# Patient Record
Sex: Male | Born: 1950 | Race: White | Hispanic: No | Marital: Married | Smoking: Former smoker
Health system: Southern US, Community
[De-identification: ages and names within clinical notes are randomized; demographics above are authoritative.]

## PROBLEM LIST (undated history)

## (undated) DIAGNOSIS — R0600 Dyspnea, unspecified: Secondary | ICD-10-CM

## (undated) DIAGNOSIS — M109 Gout, unspecified: Secondary | ICD-10-CM

## (undated) DIAGNOSIS — W3400XA Accidental discharge from unspecified firearms or gun, initial encounter: Secondary | ICD-10-CM

## (undated) DIAGNOSIS — Z8719 Personal history of other diseases of the digestive system: Secondary | ICD-10-CM

## (undated) DIAGNOSIS — E119 Type 2 diabetes mellitus without complications: Secondary | ICD-10-CM

## (undated) DIAGNOSIS — K219 Gastro-esophageal reflux disease without esophagitis: Secondary | ICD-10-CM

## (undated) DIAGNOSIS — H9190 Unspecified hearing loss, unspecified ear: Secondary | ICD-10-CM

## (undated) DIAGNOSIS — R490 Dysphonia: Secondary | ICD-10-CM

## (undated) DIAGNOSIS — E7800 Pure hypercholesterolemia, unspecified: Secondary | ICD-10-CM

## (undated) DIAGNOSIS — R51 Headache: Secondary | ICD-10-CM

## (undated) DIAGNOSIS — I2510 Atherosclerotic heart disease of native coronary artery without angina pectoris: Secondary | ICD-10-CM

## (undated) DIAGNOSIS — T4145XA Adverse effect of unspecified anesthetic, initial encounter: Secondary | ICD-10-CM

## (undated) DIAGNOSIS — I350 Nonrheumatic aortic (valve) stenosis: Secondary | ICD-10-CM

## (undated) DIAGNOSIS — I10 Essential (primary) hypertension: Secondary | ICD-10-CM

## (undated) DIAGNOSIS — T8859XA Other complications of anesthesia, initial encounter: Secondary | ICD-10-CM

## (undated) DIAGNOSIS — M1990 Unspecified osteoarthritis, unspecified site: Secondary | ICD-10-CM

## (undated) DIAGNOSIS — I6522 Occlusion and stenosis of left carotid artery: Secondary | ICD-10-CM

## (undated) DIAGNOSIS — R7303 Prediabetes: Secondary | ICD-10-CM

## (undated) HISTORY — PX: EYE SURGERY: SHX253

## (undated) HISTORY — PX: CARDIAC CATHETERIZATION: SHX172

## (undated) HISTORY — PX: CORONARY ARTERY BYPASS GRAFT: SHX141

## (undated) HISTORY — PX: COLONOSCOPY W/ BIOPSIES AND POLYPECTOMY: SHX1376

---
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
## 1971-09-14 HISTORY — PX: KNEE SURGERY: SHX244

## 2011-05-31 DIAGNOSIS — I2510 Atherosclerotic heart disease of native coronary artery without angina pectoris: Secondary | ICD-10-CM

## 2011-06-04 DIAGNOSIS — I2510 Atherosclerotic heart disease of native coronary artery without angina pectoris: Secondary | ICD-10-CM

## 2011-07-07 ENCOUNTER — Encounter: Payer: Self-pay | Admitting: Thoracic Surgery (Cardiothoracic Vascular Surgery)

## 2011-07-07 ENCOUNTER — Ambulatory Visit (INDEPENDENT_AMBULATORY_CARE_PROVIDER_SITE_OTHER): Payer: BC Managed Care – PPO | Admitting: Thoracic Surgery (Cardiothoracic Vascular Surgery)

## 2011-07-07 DIAGNOSIS — I2510 Atherosclerotic heart disease of native coronary artery without angina pectoris: Secondary | ICD-10-CM

## 2011-07-07 DIAGNOSIS — Z951 Presence of aortocoronary bypass graft: Secondary | ICD-10-CM

## 2011-07-07 NOTE — Progress Notes (Signed)
The patient is doing very well. He has had no chest pain. Sternum stable, wounds well healed. He is walking 3 miles a day. No smoking. He has seen his cardiologist. 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 Sternal precautions were reviewed. RTC prn.

## 2013-02-11 HISTORY — PX: CORONARY ARTERY BYPASS GRAFT: SHX141

## 2015-04-14 DEATH — deceased

## 2016-10-14 HISTORY — PX: KNEE CARTILAGE SURGERY: SHX688

## 2017-10-20 ENCOUNTER — Other Ambulatory Visit: Payer: Self-pay | Admitting: Orthopedic Surgery

## 2017-10-26 ENCOUNTER — Encounter (HOSPITAL_COMMUNITY): Payer: Self-pay

## 2017-10-26 ENCOUNTER — Ambulatory Visit (HOSPITAL_COMMUNITY)
Admission: RE | Admit: 2017-10-26 | Discharge: 2017-10-26 | Disposition: A | Discharge status: Home / Self Care | Payer: Medicare Other | Source: Ambulatory Visit | Attending: Orthopedic Surgery | Admitting: Orthopedic Surgery

## 2017-10-26 ENCOUNTER — Other Ambulatory Visit: Payer: Self-pay

## 2017-10-26 ENCOUNTER — Encounter (HOSPITAL_COMMUNITY)
Admission: RE | Admit: 2017-10-26 | Discharge: 2017-10-26 | Disposition: A | Discharge status: Home / Self Care | Payer: Medicare Other | Source: Ambulatory Visit | Attending: Orthopedic Surgery | Admitting: Orthopedic Surgery

## 2017-10-26 DIAGNOSIS — Z01810 Encounter for preprocedural cardiovascular examination: Secondary | ICD-10-CM | POA: Diagnosis present

## 2017-10-26 DIAGNOSIS — M1711 Unilateral primary osteoarthritis, right knee: Secondary | ICD-10-CM | POA: Insufficient documentation

## 2017-10-26 DIAGNOSIS — Z01818 Encounter for other preprocedural examination: Secondary | ICD-10-CM

## 2017-10-26 DIAGNOSIS — Z01812 Encounter for preprocedural laboratory examination: Secondary | ICD-10-CM | POA: Diagnosis present

## 2017-10-26 HISTORY — DX: Unspecified osteoarthritis, unspecified site: M19.90

## 2017-10-26 HISTORY — DX: Gout, unspecified: M10.9

## 2017-10-26 HISTORY — DX: Headache: R51

## 2017-10-26 HISTORY — DX: Gastro-esophageal reflux disease without esophagitis: K21.9

## 2017-10-26 HISTORY — DX: Essential (primary) hypertension: I10

## 2017-10-26 HISTORY — DX: Accidental discharge from unspecified firearms or gun, initial encounter: W34.00XA

## 2017-10-26 HISTORY — DX: Pure hypercholesterolemia, unspecified: E78.00

## 2017-10-26 HISTORY — DX: Nonrheumatic aortic (valve) stenosis: I35.0

## 2017-10-26 HISTORY — DX: Personal history of other diseases of the digestive system: Z87.19

## 2017-10-26 HISTORY — DX: Atherosclerotic heart disease of native coronary artery without angina pectoris: I25.10

## 2017-10-26 HISTORY — DX: Unspecified hearing loss, unspecified ear: H91.90

## 2017-10-26 LAB — URINALYSIS, ROUTINE W REFLEX MICROSCOPIC
Bilirubin Urine: NEGATIVE
Glucose, UA: NEGATIVE mg/dL
Hgb urine dipstick: NEGATIVE
Ketones, ur: NEGATIVE mg/dL
LEUKOCYTES UA: NEGATIVE
Nitrite: NEGATIVE
PROTEIN: NEGATIVE mg/dL
Specific Gravity, Urine: 1.013 (ref 1.005–1.030)
pH: 7 (ref 5.0–8.0)

## 2017-10-26 LAB — CBC WITH DIFFERENTIAL/PLATELET
BASOS PCT: 0 %
Basophils Absolute: 0 10*3/uL (ref 0.0–0.1)
Eosinophils Absolute: 0.2 10*3/uL (ref 0.0–0.7)
Eosinophils Relative: 2 %
HEMATOCRIT: 45.5 % (ref 39.0–52.0)
Hemoglobin: 15.5 g/dL (ref 13.0–17.0)
Lymphocytes Relative: 25 %
Lymphs Abs: 1.8 10*3/uL (ref 0.7–4.0)
MCH: 32.2 pg (ref 26.0–34.0)
MCHC: 34.1 g/dL (ref 30.0–36.0)
MCV: 94.4 fL (ref 78.0–100.0)
MONO ABS: 0.5 10*3/uL (ref 0.1–1.0)
MONOS PCT: 6 %
NEUTROS ABS: 4.8 10*3/uL (ref 1.7–7.7)
Neutrophils Relative %: 67 %
Platelets: 215 10*3/uL (ref 150–400)
RBC: 4.82 MIL/uL (ref 4.22–5.81)
RDW: 13 % (ref 11.5–15.5)
WBC: 7.3 10*3/uL (ref 4.0–10.5)

## 2017-10-26 LAB — TYPE AND SCREEN
ABO/RH(D): A POS
Antibody Screen: NEGATIVE

## 2017-10-26 LAB — BASIC METABOLIC PANEL
ANION GAP: 12 (ref 5–15)
BUN: 16 mg/dL (ref 6–20)
CALCIUM: 9.7 mg/dL (ref 8.9–10.3)
CO2: 24 mmol/L (ref 22–32)
Chloride: 101 mmol/L (ref 101–111)
Creatinine, Ser: 0.86 mg/dL (ref 0.61–1.24)
GFR calc Af Amer: >60 mL/min (ref >60)
GFR calc non Af Amer: >60 mL/min (ref >60)
GLUCOSE: 135 mg/dL — AB (ref 65–99)
Potassium: 3.8 mmol/L (ref 3.5–5.1)
Sodium: 137 mmol/L (ref 135–145)

## 2017-10-26 LAB — ABO/RH: ABO/RH(D): A POS

## 2017-10-26 LAB — SURGICAL PCR SCREEN
MRSA, PCR: NEGATIVE
Staphylococcus aureus: NEGATIVE

## 2017-10-26 LAB — PROTIME-INR
INR: 0.96
Prothrombin Time: 12.7 seconds (ref 11.4–15.2)

## 2017-10-26 LAB — APTT: aPTT: 31 seconds (ref 24–36)

## 2017-10-26 NOTE — Pre-Procedure Instructions (Addendum)
    Jordan FordyceRoger Chaney Nephew  10/26/2017      [facility] [phone] - [place], [place] - [address] [facility] [address] [place] [id number] Phone: [phone] Fax: [phone]    Your procedure is scheduled on Wednesday, November 02, 2017  [facility]mitting at 10:45 A.M.  Call this number if you have problems the morning of surgery:  [phone]   Remember: Follow doctors instructions regarding Aspirin  Do not eat food or drink liquids after midnight Tuesday, November 01, 2017  Take these medicines the morning of surgery with A SIP OF WATER : allopurinol (ZYLOPRIM), metoprolol succinate (TOPROL-XL), omeprazole (PRILOSEC) Stop taking vitamins, fish oil and herbal medications. Do not take any NSAIDs ie: Ibuprofen, Meloxicam (Mobic)Advil, Naproxen (Aleve), Motrin, BC and Goody Powder; stop now.  Do not wear jewelry, make-up or nail polish.  Do not wear lotions, powders, or perfumes, or deodorant.  Do not shave 48 hours prior to surgery.  Men may shave face and neck.  Do not bring valuables to the hospital.  [facility] is not responsible for any belongings or valuables.  Contacts, dentures or bridgework may not be worn into surgery.  Leave your suitcase in the car.  After surgery it may be brought to your room. For patients admitted to the hospital, discharge time will be determined by your treatment team. Special instructions: Shower the night before surgery and the morning of surgery with CHG. Please read over the following fact sheets that you were given. Pain Booklet, Coughing and Deep Breathing, Blood Transfusion Information, Total Joint Packet, MRSA Information and Surgical Site Infection Prevention

## 2017-10-26 NOTE — Progress Notes (Signed)
Pt denies any acute cardiopulmonary issues. Pt under the care of Dr. Hanley Haysosario, [facility]. Pt denies having recent labs. Pt denies having a chest x ray but stated that an EKG was performed within the last year and physician has performed a cardiac cath, echo and stress test in the past; records requested from Dr. Hanley Haysosario. Clearance note on chart. Pt stated that he was instructed to continue taking Aspirin but not on the DOS.  Anesthesia asked to review pt history.

## 2017-10-26 NOTE — Progress Notes (Signed)
   10/26/17 0930  OBSTRUCTIVE SLEEP APNEA  Have you ever been diagnosed with sleep apnea through a sleep study? No  Do you snore loudly (loud enough to be heard through closed doors)?  1  Do you often feel tired, fatigued, or sleepy during the daytime (such as falling asleep during driving or talking to someone)? 0  Has anyone observed you stop breathing during your sleep? 0  Do you have, or are you being treated for high blood pressure? 1  BMI more than 35 kg/m2? 1  Age > 50 (1-yes) 1  Neck circumference greater than:Male 16 inches or larger, Male 17inches or larger? 1  Male Gender (Yes=1) 1  Obstructive Sleep Apnea Score 6

## 2017-10-27 ENCOUNTER — Encounter (HOSPITAL_COMMUNITY): Payer: Self-pay | Admitting: Emergency Medicine

## 2017-10-27 ENCOUNTER — Encounter (HOSPITAL_COMMUNITY): Payer: Self-pay
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
## 2017-10-31 ENCOUNTER — Other Ambulatory Visit: Payer: Self-pay | Admitting: Orthopedic Surgery

## 2017-11-01 ENCOUNTER — Other Ambulatory Visit: Payer: Self-pay

## 2017-11-01 DIAGNOSIS — M1711 Unilateral primary osteoarthritis, right knee: Secondary | ICD-10-CM | POA: Diagnosis present

## 2017-11-01 DIAGNOSIS — I6523 Occlusion and stenosis of bilateral carotid arteries: Secondary | ICD-10-CM

## 2017-11-01 MED ORDER — TRANEXAMIC ACID 1000 MG/10ML IV SOLN
2000.0000 mg | INTRAVENOUS | Status: DC
  Filled 2017-11-01: qty 20

## 2017-11-01 MED ORDER — SODIUM CHLORIDE 0.9 % IV SOLN
1000.0000 mg | INTRAVENOUS | Status: DC
  Filled 2017-11-01: qty 10

## 2017-11-01 MED ORDER — BUPIVACAINE LIPOSOME 1.3 % IJ SUSP
20.0000 mL | Freq: Once | INTRAMUSCULAR | Status: DC
  Filled 2017-11-01: qty 20
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
## 2017-11-02 ENCOUNTER — Other Ambulatory Visit: Payer: Self-pay | Admitting: *Deleted

## 2017-11-02 ENCOUNTER — Encounter (HOSPITAL_COMMUNITY): Admission: RE | Payer: Self-pay | Source: Ambulatory Visit

## 2017-11-02 ENCOUNTER — Ambulatory Visit (INDEPENDENT_AMBULATORY_CARE_PROVIDER_SITE_OTHER): Payer: Medicare Other | Admitting: Vascular Surgery

## 2017-11-02 ENCOUNTER — Inpatient Hospital Stay (HOSPITAL_COMMUNITY): Admission: RE | Admit: 2017-11-02 | Payer: Medicare Other | Source: Ambulatory Visit | Admitting: Orthopedic Surgery

## 2017-11-02 ENCOUNTER — Ambulatory Visit (HOSPITAL_COMMUNITY)
Admission: RE | Admit: 2017-11-02 | Discharge: 2017-11-02 | Disposition: A | Discharge status: Home / Self Care | Payer: Medicare Other | Source: Ambulatory Visit | Attending: Vascular Surgery | Admitting: Vascular Surgery

## 2017-11-02 ENCOUNTER — Encounter: Payer: Self-pay | Admitting: *Deleted

## 2017-11-02 ENCOUNTER — Other Ambulatory Visit: Payer: Self-pay

## 2017-11-02 ENCOUNTER — Encounter: Payer: Self-pay | Admitting: Vascular Surgery

## 2017-11-02 DIAGNOSIS — I779 Disorder of arteries and arterioles, unspecified: Secondary | ICD-10-CM | POA: Insufficient documentation

## 2017-11-02 DIAGNOSIS — I6523 Occlusion and stenosis of bilateral carotid arteries: Secondary | ICD-10-CM

## 2017-11-02 DIAGNOSIS — I739 Peripheral vascular disease, unspecified: Secondary | ICD-10-CM

## 2017-11-02 LAB — VAS US CAROTID
LCCADSYS: 79 cm/s
LEFT ECA DIAS: -20 cm/s
LEFT VERTEBRAL DIAS: 8 cm/s
LICAPDIAS: 60 cm/s
Left CCA dist dias: 25 cm/s
Left CCA prox dias: 23 cm/s
Left CCA prox sys: 91 cm/s
Left ICA dist dias: -21 cm/s
Left ICA dist sys: -47 cm/s
Left ICA prox sys: 235 cm/s
RCCADSYS: -57 cm/s
RCCAPDIAS: 15 cm/s
RCCAPSYS: 95 cm/s
RIGHT CCA MID DIAS: 22 cm/s
RIGHT ECA DIAS: -20 cm/s

## 2017-11-02 SURGERY — ARTHROPLASTY, KNEE, TOTAL
Anesthesia: Spinal | Laterality: Right
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
## 2017-11-07 ENCOUNTER — Encounter (HOSPITAL_COMMUNITY): Payer: Self-pay | Admitting: *Deleted

## 2017-11-07 NOTE — Progress Notes (Signed)
Pt now having surgery for carotid endarterectomy on 11/08/17. Pt made aware to take Aspirin in addition to the other medications that were previously instructed to take. Pt verbalized understanding.

## 2017-11-08 ENCOUNTER — Other Ambulatory Visit: Payer: Self-pay

## 2017-11-08 ENCOUNTER — Encounter (HOSPITAL_COMMUNITY)
Admission: RE | Disposition: A | Discharge status: Home / Self Care | Payer: Self-pay | Source: Ambulatory Visit | Attending: Vascular Surgery

## 2017-11-08 ENCOUNTER — Inpatient Hospital Stay (HOSPITAL_COMMUNITY)
Admission: RE | Admit: 2017-11-08 | Discharge: 2017-11-09 | DRG: 039 | Disposition: A | Discharge status: Home / Self Care | Payer: Medicare Other | Source: Ambulatory Visit | Attending: Vascular Surgery | Admitting: Vascular Surgery

## 2017-11-08 ENCOUNTER — Inpatient Hospital Stay (HOSPITAL_COMMUNITY): Payer: Medicare Other | Admitting: Certified Registered"

## 2017-11-08 DIAGNOSIS — K219 Gastro-esophageal reflux disease without esophagitis: Secondary | ICD-10-CM | POA: Diagnosis present

## 2017-11-08 DIAGNOSIS — M1991 Primary osteoarthritis, unspecified site: Secondary | ICD-10-CM | POA: Diagnosis present

## 2017-11-08 DIAGNOSIS — Z791 Long term (current) use of non-steroidal anti-inflammatories (NSAID): Secondary | ICD-10-CM

## 2017-11-08 DIAGNOSIS — Z79899 Other long term (current) drug therapy: Secondary | ICD-10-CM

## 2017-11-08 DIAGNOSIS — K449 Diaphragmatic hernia without obstruction or gangrene: Secondary | ICD-10-CM | POA: Diagnosis present

## 2017-11-08 DIAGNOSIS — Z7982 Long term (current) use of aspirin: Secondary | ICD-10-CM | POA: Diagnosis not present

## 2017-11-08 DIAGNOSIS — I6529 Occlusion and stenosis of unspecified carotid artery: Secondary | ICD-10-CM | POA: Diagnosis present

## 2017-11-08 DIAGNOSIS — I10 Essential (primary) hypertension: Secondary | ICD-10-CM | POA: Diagnosis present

## 2017-11-08 DIAGNOSIS — H9190 Unspecified hearing loss, unspecified ear: Secondary | ICD-10-CM | POA: Diagnosis present

## 2017-11-08 DIAGNOSIS — R29810 Facial weakness: Secondary | ICD-10-CM | POA: Diagnosis not present

## 2017-11-08 DIAGNOSIS — I2510 Atherosclerotic heart disease of native coronary artery without angina pectoris: Secondary | ICD-10-CM | POA: Diagnosis present

## 2017-11-08 DIAGNOSIS — Z87891 Personal history of nicotine dependence: Secondary | ICD-10-CM

## 2017-11-08 DIAGNOSIS — I6522 Occlusion and stenosis of left carotid artery: Principal | ICD-10-CM | POA: Diagnosis present

## 2017-11-08 DIAGNOSIS — R490 Dysphonia: Secondary | ICD-10-CM | POA: Diagnosis not present

## 2017-11-08 DIAGNOSIS — Z951 Presence of aortocoronary bypass graft: Secondary | ICD-10-CM | POA: Diagnosis not present

## 2017-11-08 DIAGNOSIS — Z6836 Body mass index (BMI) 36.0-36.9, adult: Secondary | ICD-10-CM

## 2017-11-08 HISTORY — PX: ENDARTERECTOMY: SHX5162

## 2017-11-08 HISTORY — PX: PATCH ANGIOPLASTY: SHX6230

## 2017-11-08 HISTORY — DX: Occlusion and stenosis of left carotid artery: I65.22

## 2017-11-08 LAB — HEPATIC FUNCTION PANEL
ALBUMIN: 3.9 g/dL (ref 3.5–5.0)
ALT: 58 U/L (ref 17–63)
AST: 40 U/L (ref 15–41)
Alkaline Phosphatase: 39 U/L (ref 38–126)
Bilirubin, Direct: 0.2 mg/dL (ref 0.1–0.5)
Indirect Bilirubin: 1 mg/dL — ABNORMAL HIGH (ref 0.3–0.9)
TOTAL PROTEIN: 7.2 g/dL (ref 6.5–8.1)
Total Bilirubin: 1.2 mg/dL (ref 0.3–1.2)

## 2017-11-08 SURGERY — ENDARTERECTOMY, CAROTID
Anesthesia: General | Site: Neck | Laterality: Left

## 2017-11-08 MED ORDER — SUGAMMADEX SODIUM 200 MG/2ML IV SOLN
INTRAVENOUS | Status: AC
  Filled 2017-11-08: qty 2

## 2017-11-08 MED ORDER — LABETALOL HCL 5 MG/ML IV SOLN: 10.0000 mg | INTRAVENOUS | Status: DC | PRN

## 2017-11-08 MED ORDER — FENTANYL CITRATE (PF) 100 MCG/2ML IJ SOLN
25.0000 ug | INTRAMUSCULAR | Status: DC | PRN
  Administered 2017-11-08: 50 ug via INTRAVENOUS

## 2017-11-08 MED ORDER — BISACODYL 5 MG PO TBEC: 5.0000 mg | DELAYED_RELEASE_TABLET | Freq: Every day | ORAL | Status: DC | PRN

## 2017-11-08 MED ORDER — ACETAMINOPHEN 650 MG RE SUPP: 325.0000 mg | RECTAL | Status: DC | PRN

## 2017-11-08 MED ORDER — SODIUM CHLORIDE 0.9 % IV SOLN
1.5000 g | Freq: Two times a day (BID) | INTRAVENOUS | Status: AC
  Administered 2017-11-08 – 2017-11-09 (×2): 1.5 g via INTRAVENOUS
  Filled 2017-11-08 (×2): qty 1.5

## 2017-11-08 MED ORDER — SODIUM CHLORIDE 0.9 % IV SOLN
INTRAVENOUS | Status: DC
  Administered 2017-11-08: 18:00:00 via INTRAVENOUS

## 2017-11-08 MED ORDER — CHLORHEXIDINE GLUCONATE 4 % EX LIQD: 60.0000 mL | Freq: Once | CUTANEOUS | Status: DC

## 2017-11-08 MED ORDER — GUAIFENESIN-DM 100-10 MG/5ML PO SYRP: 15.0000 mL | ORAL_SOLUTION | ORAL | Status: DC | PRN

## 2017-11-08 MED ORDER — MELOXICAM 7.5 MG PO TABS
15.0000 mg | ORAL_TABLET | Freq: Every day | ORAL | Status: DC
  Administered 2017-11-09: 15 mg via ORAL
  Filled 2017-11-08 (×2): qty 2

## 2017-11-08 MED ORDER — FENTANYL CITRATE (PF) 100 MCG/2ML IJ SOLN
INTRAMUSCULAR | Status: AC
  Filled 2017-11-08: qty 2

## 2017-11-08 MED ORDER — DEXTRAN 40 IN SALINE 10-0.9 % IV SOLN
INTRAVENOUS | Status: AC | PRN
  Administered 2017-11-08: 500 mL

## 2017-11-08 MED ORDER — HEPARIN SODIUM (PORCINE) 5000 UNIT/ML IJ SOLN
INTRAMUSCULAR | Status: DC | PRN
  Administered 2017-11-08: 500 mL

## 2017-11-08 MED ORDER — LIDOCAINE HCL (CARDIAC) 20 MG/ML IV SOLN
INTRAVENOUS | Status: DC | PRN
  Administered 2017-11-08: 60 mg via INTRAVENOUS

## 2017-11-08 MED ORDER — ACETAMINOPHEN 325 MG PO TABS: 325.0000 mg | ORAL_TABLET | ORAL | Status: DC | PRN

## 2017-11-08 MED ORDER — SODIUM CHLORIDE 0.9 % IV SOLN
0.0125 ug/kg/min | INTRAVENOUS | Status: AC
  Administered 2017-11-08: 12:00:00 via INTRAVENOUS
  Administered 2017-11-08: .2 ug/kg/min via INTRAVENOUS
  Filled 2017-11-08: qty 2000

## 2017-11-08 MED ORDER — PHENYLEPHRINE HCL 10 MG/ML IJ SOLN
INTRAVENOUS | Status: DC | PRN
  Administered 2017-11-08: 30 ug/min via INTRAVENOUS

## 2017-11-08 MED ORDER — SODIUM CHLORIDE 0.9 % IV SOLN
500.0000 mL | Freq: Once | INTRAVENOUS | Status: AC | PRN
Start: 2017-11-08 — End: 2017-11-08
  Administered 2017-11-08: 500 mL via INTRAVENOUS

## 2017-11-08 MED ORDER — ALUM & MAG HYDROXIDE-SIMETH 200-200-20 MG/5ML PO SUSP: 15.0000 mL | ORAL | Status: DC | PRN

## 2017-11-08 MED ORDER — HEPARIN SODIUM (PORCINE) 1000 UNIT/ML IJ SOLN
INTRAMUSCULAR | Status: DC | PRN
  Administered 2017-11-08: 2000 [IU] via INTRAVENOUS
  Administered 2017-11-08: 12000 [IU] via INTRAVENOUS

## 2017-11-08 MED ORDER — PHENOL 1.4 % MT LIQD: 1.0000 | OROMUCOSAL | Status: DC | PRN

## 2017-11-08 MED ORDER — METOPROLOL TARTRATE 5 MG/5ML IV SOLN: 2.0000 mg | INTRAVENOUS | Status: DC | PRN

## 2017-11-08 MED ORDER — ASPIRIN EC 325 MG PO TBEC
325.0000 mg | DELAYED_RELEASE_TABLET | Freq: Every day | ORAL | Status: DC
  Filled 2017-11-08: qty 1

## 2017-11-08 MED ORDER — ONDANSETRON HCL 4 MG/2ML IJ SOLN: 4.0000 mg | Freq: Four times a day (QID) | INTRAMUSCULAR | Status: DC | PRN

## 2017-11-08 MED ORDER — ALLOPURINOL 100 MG PO TABS
100.0000 mg | ORAL_TABLET | Freq: Every day | ORAL | Status: DC
  Administered 2017-11-09: 100 mg via ORAL
  Filled 2017-11-08: qty 1

## 2017-11-08 MED ORDER — SODIUM CHLORIDE 0.9 % IV SOLN
INTRAVENOUS | Status: AC
  Filled 2017-11-08: qty 1.5

## 2017-11-08 MED ORDER — METOPROLOL SUCCINATE ER 25 MG PO TB24
25.0000 mg | ORAL_TABLET | Freq: Every day | ORAL | Status: DC
  Administered 2017-11-09: 25 mg via ORAL
  Filled 2017-11-08: qty 1

## 2017-11-08 MED ORDER — ONDANSETRON HCL 4 MG/2ML IJ SOLN
INTRAMUSCULAR | Status: AC
  Filled 2017-11-08: qty 2

## 2017-11-08 MED ORDER — PROTAMINE SULFATE 10 MG/ML IV SOLN
INTRAVENOUS | Status: DC | PRN
  Administered 2017-11-08: 60 mg via INTRAVENOUS

## 2017-11-08 MED ORDER — SODIUM CHLORIDE 0.9 % IV SOLN: INTRAVENOUS | Status: DC

## 2017-11-08 MED ORDER — LISINOPRIL 10 MG PO TABS
20.0000 mg | ORAL_TABLET | Freq: Every day | ORAL | Status: DC
  Administered 2017-11-09: 20 mg via ORAL
  Filled 2017-11-08: qty 2

## 2017-11-08 MED ORDER — PROPOFOL 10 MG/ML IV BOLUS
INTRAVENOUS | Status: DC | PRN
  Administered 2017-11-08: 70 mg via INTRAVENOUS
  Administered 2017-11-08: 60 mg via INTRAVENOUS

## 2017-11-08 MED ORDER — SENNOSIDES-DOCUSATE SODIUM 8.6-50 MG PO TABS: 1.0000 | ORAL_TABLET | Freq: Every evening | ORAL | Status: DC | PRN

## 2017-11-08 MED ORDER — PROPOFOL 10 MG/ML IV BOLUS
INTRAVENOUS | Status: AC
  Filled 2017-11-08: qty 20

## 2017-11-08 MED ORDER — HEPARIN SODIUM (PORCINE) 1000 UNIT/ML IJ SOLN
INTRAMUSCULAR | Status: AC
  Filled 2017-11-08: qty 2

## 2017-11-08 MED ORDER — POTASSIUM CHLORIDE CRYS ER 20 MEQ PO TBCR: 20.0000 meq | EXTENDED_RELEASE_TABLET | Freq: Every day | ORAL | Status: DC | PRN

## 2017-11-08 MED ORDER — DEXAMETHASONE SODIUM PHOSPHATE 10 MG/ML IJ SOLN
INTRAMUSCULAR | Status: AC
  Filled 2017-11-08: qty 1

## 2017-11-08 MED ORDER — HYDROCHLOROTHIAZIDE 25 MG PO TABS
25.0000 mg | ORAL_TABLET | Freq: Every day | ORAL | Status: DC
  Administered 2017-11-09: 25 mg via ORAL
  Filled 2017-11-08: qty 1

## 2017-11-08 MED ORDER — SODIUM CHLORIDE 0.9 % IV SOLN
0.0125 ug/kg/min | INTRAVENOUS | Status: DC
  Filled 2017-11-08: qty 2000

## 2017-11-08 MED ORDER — ONDANSETRON HCL 4 MG/2ML IJ SOLN
INTRAMUSCULAR | Status: DC | PRN
  Administered 2017-11-08: 4 mg via INTRAVENOUS

## 2017-11-08 MED ORDER — 0.9 % SODIUM CHLORIDE (POUR BTL) OPTIME
TOPICAL | Status: DC | PRN
  Administered 2017-11-08: 2000 mL

## 2017-11-08 MED ORDER — LIDOCAINE HCL (PF) 1 % IJ SOLN
INTRAMUSCULAR | Status: AC
  Filled 2017-11-08: qty 30

## 2017-11-08 MED ORDER — SODIUM CHLORIDE 0.9 % IV SOLN
1.5000 g | INTRAVENOUS | Status: AC
  Administered 2017-11-08: 1.5 g via INTRAVENOUS

## 2017-11-08 MED ORDER — DOCUSATE SODIUM 100 MG PO CAPS
100.0000 mg | ORAL_CAPSULE | Freq: Every day | ORAL | Status: DC
  Administered 2017-11-09: 100 mg via ORAL
  Filled 2017-11-08: qty 1

## 2017-11-08 MED ORDER — HEMOSTATIC AGENTS (NO CHARGE) OPTIME
TOPICAL | Status: DC | PRN
  Administered 2017-11-08: 1 via TOPICAL

## 2017-11-08 MED ORDER — OXYCODONE-ACETAMINOPHEN 5-325 MG PO TABS
1.0000 | ORAL_TABLET | ORAL | Status: DC | PRN
  Administered 2017-11-08 – 2017-11-09 (×3): 2 via ORAL
  Filled 2017-11-08 (×3): qty 2

## 2017-11-08 MED ORDER — EPHEDRINE SULFATE 50 MG/ML IJ SOLN
INTRAMUSCULAR | Status: DC | PRN
  Administered 2017-11-08 (×3): 5 mg via INTRAVENOUS
  Administered 2017-11-08: 10 mg via INTRAVENOUS
  Administered 2017-11-08 (×2): 5 mg via INTRAVENOUS

## 2017-11-08 MED ORDER — LACTATED RINGERS IV SOLN
INTRAVENOUS | Status: DC
  Administered 2017-11-08: 50 mL/h via INTRAVENOUS
  Administered 2017-11-08: 12:00:00 via INTRAVENOUS

## 2017-11-08 MED ORDER — MAGNESIUM SULFATE 2 GM/50ML IV SOLN: 2.0000 g | Freq: Every day | INTRAVENOUS | Status: DC | PRN

## 2017-11-08 MED ORDER — DEXTRAN 40 IN D5W 10 % IV SOLN
INTRAVENOUS | Status: AC
  Filled 2017-11-08: qty 500

## 2017-11-08 MED ORDER — FENTANYL CITRATE (PF) 250 MCG/5ML IJ SOLN
INTRAMUSCULAR | Status: AC
  Filled 2017-11-08: qty 5

## 2017-11-08 MED ORDER — ATORVASTATIN CALCIUM 40 MG PO TABS
40.0000 mg | ORAL_TABLET | Freq: Every day | ORAL | Status: DC
  Administered 2017-11-08 – 2017-11-09 (×2): 40 mg via ORAL
  Filled 2017-11-08 (×2): qty 1

## 2017-11-08 MED ORDER — PANTOPRAZOLE SODIUM 40 MG PO TBEC
40.0000 mg | DELAYED_RELEASE_TABLET | Freq: Every day | ORAL | Status: DC
  Administered 2017-11-09: 40 mg via ORAL
  Filled 2017-11-08: qty 1

## 2017-11-08 MED ORDER — DEXAMETHASONE SODIUM PHOSPHATE 10 MG/ML IJ SOLN
INTRAMUSCULAR | Status: DC | PRN
  Administered 2017-11-08: 10 mg via INTRAVENOUS

## 2017-11-08 MED ORDER — FENTANYL CITRATE (PF) 100 MCG/2ML IJ SOLN
INTRAMUSCULAR | Status: DC | PRN
  Administered 2017-11-08 (×2): 50 ug via INTRAVENOUS

## 2017-11-08 MED ORDER — ROCURONIUM BROMIDE 100 MG/10ML IV SOLN
INTRAVENOUS | Status: DC | PRN
  Administered 2017-11-08 (×2): 70 mg via INTRAVENOUS

## 2017-11-08 MED ORDER — PROTAMINE SULFATE 10 MG/ML IV SOLN
INTRAVENOUS | Status: AC
  Filled 2017-11-08: qty 10

## 2017-11-08 MED ORDER — PHENYLEPHRINE HCL 10 MG/ML IJ SOLN
INTRAMUSCULAR | Status: DC | PRN
  Administered 2017-11-08: 120 ug via INTRAVENOUS

## 2017-11-08 MED ORDER — ENOXAPARIN SODIUM 30 MG/0.3ML ~~LOC~~ SOLN
30.0000 mg | SUBCUTANEOUS | Status: DC
  Administered 2017-11-09: 30 mg via SUBCUTANEOUS
  Filled 2017-11-08: qty 0.3

## 2017-11-08 MED ORDER — HEPARIN SODIUM (PORCINE) 1000 UNIT/ML IJ SOLN
INTRAMUSCULAR | Status: AC
  Filled 2017-11-08: qty 1

## 2017-11-08 MED ORDER — SUGAMMADEX SODIUM 200 MG/2ML IV SOLN
INTRAVENOUS | Status: DC | PRN
  Administered 2017-11-08: 200 mg via INTRAVENOUS

## 2017-11-08 MED ORDER — HYDRALAZINE HCL 20 MG/ML IJ SOLN: 5.0000 mg | INTRAMUSCULAR | Status: DC | PRN

## 2017-11-08 MED ORDER — ASPIRIN 81 MG PO TABS: 81.0000 mg | ORAL_TABLET | Freq: Every day | ORAL | Status: DC

## 2017-11-08 MED ORDER — GLYCOPYRROLATE 0.2 MG/ML IJ SOLN
INTRAMUSCULAR | Status: DC | PRN
  Administered 2017-11-08: 0.1 mg via INTRAVENOUS

## 2017-11-08 SURGICAL SUPPLY — 57 items
BAG DECANTER FOR FLEXI CONT (MISCELLANEOUS) ×3 IMPLANT
CANISTER SUCT 3000ML PPV (MISCELLANEOUS) ×3 IMPLANT
CATH ROBINSON RED A/P 18FR (CATHETERS) ×3 IMPLANT
CLIP VESOCCLUDE MED 24/CT (CLIP) ×3 IMPLANT
CLIP VESOCCLUDE SM WIDE 24/CT (CLIP) ×3 IMPLANT
COVER PROBE W GEL 5X96 (DRAPES) ×3 IMPLANT
CRADLE DONUT ADULT HEAD (MISCELLANEOUS) ×3 IMPLANT
DERMABOND ADVANCED (GAUZE/BANDAGES/DRESSINGS) ×2
DERMABOND ADVANCED .7 DNX12 (GAUZE/BANDAGES/DRESSINGS) ×1 IMPLANT
ELECT REM PT RETURN 9FT ADLT (ELECTROSURGICAL) ×3
ELECTRODE REM PT RTRN 9FT ADLT (ELECTROSURGICAL) ×1 IMPLANT
GAUZE SPONGE 2X2 8PLY STRL LF (GAUZE/BANDAGES/DRESSINGS) ×1 IMPLANT
GAUZE SPONGE 4X4 16PLY XRAY LF (GAUZE/BANDAGES/DRESSINGS) ×3 IMPLANT
GLOVE BIO SURGEON STRL SZ7 (GLOVE) ×3 IMPLANT
GLOVE BIOGEL PI IND STRL 6.5 (GLOVE) ×3 IMPLANT
GLOVE BIOGEL PI IND STRL 7.5 (GLOVE) ×2 IMPLANT
GLOVE BIOGEL PI INDICATOR 6.5 (GLOVE) ×6
GLOVE BIOGEL PI INDICATOR 7.5 (GLOVE) ×4
GLOVE ECLIPSE 6.5 STRL STRAW (GLOVE) ×9 IMPLANT
GLOVE ECLIPSE 7.0 STRL STRAW (GLOVE) ×3 IMPLANT
GLOVE SURG SS PI 6.5 STRL IVOR (GLOVE) ×3 IMPLANT
GOWN STRL REUS W/ TWL LRG LVL3 (GOWN DISPOSABLE) ×5 IMPLANT
GOWN STRL REUS W/ TWL XL LVL3 (GOWN DISPOSABLE) ×1 IMPLANT
GOWN STRL REUS W/TWL LRG LVL3 (GOWN DISPOSABLE) ×10
GOWN STRL REUS W/TWL XL LVL3 (GOWN DISPOSABLE) ×2
HEMOSTAT SPONGE AVITENE ULTRA (HEMOSTASIS) ×3 IMPLANT
IV ADAPTER SYR DOUBLE MALE LL (MISCELLANEOUS) ×3 IMPLANT
KIT BASIN OR (CUSTOM PROCEDURE TRAY) ×3 IMPLANT
KIT ROOM TURNOVER OR (KITS) ×3 IMPLANT
KIT SHUNT ARGYLE CAROTID ART 6 (VASCULAR PRODUCTS) ×3 IMPLANT
NEEDLE HYPO 25GX1X1/2 BEV (NEEDLE) IMPLANT
NS IRRIG 1000ML POUR BTL (IV SOLUTION) ×9 IMPLANT
PACK CAROTID (CUSTOM PROCEDURE TRAY) ×3 IMPLANT
PAD ARMBOARD 7.5X6 YLW CONV (MISCELLANEOUS) ×6 IMPLANT
PATCH VASC XENOSURE 1CMX6CM (Vascular Products) ×2 IMPLANT
PATCH VASC XENOSURE 1X6 (Vascular Products) ×1 IMPLANT
SET COLLECT BLD 21X3/4 12 PB (MISCELLANEOUS) ×3 IMPLANT
SHUNT CAROTID BYPASS 10 (VASCULAR PRODUCTS) IMPLANT
SHUNT CAROTID BYPASS 12FRX15.5 (VASCULAR PRODUCTS) IMPLANT
SPONGE GAUZE 2X2 STER 10/PKG (GAUZE/BANDAGES/DRESSINGS) ×2
SUT ETHILON 3 0 PS 1 (SUTURE) ×3 IMPLANT
SUT MNCRL AB 4-0 PS2 18 (SUTURE) ×3 IMPLANT
SUT PROLENE 6 0 BV (SUTURE) ×21 IMPLANT
SUT PROLENE 7 0 BV 1 (SUTURE) IMPLANT
SUT SILK 3 0 (SUTURE)
SUT SILK 3-0 18XBRD TIE 12 (SUTURE) IMPLANT
SUT VIC AB 3-0 SH 27 (SUTURE) ×2
SUT VIC AB 3-0 SH 27X BRD (SUTURE) ×1 IMPLANT
SYR 20CC LL (SYRINGE) ×3 IMPLANT
SYR TB 1ML LUER SLIP (SYRINGE) IMPLANT
SYSTEM CHEST DRAIN TLS 7FR (DRAIN) ×3 IMPLANT
TAPE CLOTH SURG 4X10 WHT LF (GAUZE/BANDAGES/DRESSINGS) ×3 IMPLANT
TOWEL GREEN STERILE (TOWEL DISPOSABLE) ×3 IMPLANT
TUBING ART PRESS 48 MALE/FEM (TUBING) IMPLANT
TUBING ART PRESS 72  MALE/FEM (TUBING) ×2
TUBING ART PRESS 72 MALE/FEM (TUBING) ×1 IMPLANT
WATER STERILE IRR 1000ML POUR (IV SOLUTION) ×3 IMPLANT

## 2017-11-08 NOTE — Anesthesia Preprocedure Evaluation (Signed)
Anesthesia Evaluation  Patient identified by MRN, date of birth, ID band Patient awake    Reviewed: Allergy & Precautions, NPO status , Patient's Chart, lab work & pertinent test results, reviewed documented beta blocker date and time   History of Anesthesia Complications Negative for: history of anesthetic complications  Airway Mallampati: II  TM Distance: >3 FB Neck ROM: Full    Dental  (+) Teeth Intact   Pulmonary former smoker,    breath sounds clear to auscultation       Cardiovascular hypertension, Pt. on medications and Pt. on home beta blockers + CAD, + CABG and + Peripheral Vascular Disease   Rhythm:Regular     Neuro/Psych  Headaches, neg Seizures negative psych ROS   GI/Hepatic Neg liver ROS, hiatal hernia, GERD  Medicated and Controlled,  Endo/Other  Morbid obesity  Renal/GU negative Renal ROS     Musculoskeletal  (+) Arthritis ,   Abdominal   Peds  Hematology negative hematology ROS (+)   Anesthesia Other Findings   Reproductive/Obstetrics                             Anesthesia Physical Anesthesia Plan  ASA: III  Anesthesia Plan: General   Post-op Pain Management:    Induction: Intravenous  PONV Risk Score and Plan: 2 and Ondansetron and Dexamethasone  Airway Management Planned: Oral ETT  Additional Equipment: Arterial line  Intra-op Plan:   Post-operative Plan: Extubation in OR  Informed Consent: I have reviewed the patients History and Physical, chart, labs and discussed the procedure including the risks, benefits and alternatives for the proposed anesthesia with the patient or authorized representative who has indicated his/her understanding and acceptance.   Dental advisory given  Plan Discussed with: CRNA and Surgeon  Anesthesia Plan Comments:         Anesthesia Quick Evaluation

## 2017-11-08 NOTE — Anesthesia Procedure Notes (Signed)
Arterial Line Insertion Start/End2/26/2019 9:50 AM, 11/08/2017 10:00 AM Performed by: Nils PyleBell, Kynzee Devinney T, CRNA, CRNA  Patient location: Pre-op. Preanesthetic checklist: patient identified, IV checked, site marked, risks and benefits discussed, surgical consent, monitors and equipment checked, pre-op evaluation and anesthesia consent Lidocaine 1% used for infiltration Right, radial was placed Catheter size: 20 G Hand hygiene performed  and maximum sterile barriers used   Attempts: 1 Procedure performed without using ultrasound guided technique. Ultrasound Notes:anatomy identified, needle tip was noted to be adjacent to the nerve/plexus identified and no ultrasound evidence of intravascular and/or intraneural injection Following insertion, dressing applied and Biopatch. Post procedure assessment: normal  Patient tolerated the procedure well with no immediate complications.

## 2017-11-08 NOTE — Anesthesia Procedure Notes (Signed)
Procedure Name: Intubation Date/Time: 11/08/2017 10:51 AM Performed by: Rosiland OzMeyers, Michaiah Holsopple, CRNA Pre-anesthesia Checklist: Patient identified, Emergency Drugs available, Suction available, Patient being monitored and Timeout performed Patient Re-evaluated:Patient Re-evaluated prior to induction Oxygen Delivery Method: Circle system utilized Preoxygenation: Pre-oxygenation with 100% oxygen Induction Type: IV induction Ventilation: Mask ventilation without difficulty Laryngoscope Size: Miller and 3 Grade View: Grade I Tube type: Oral Tube size: 7.5 mm Number of attempts: 1 Airway Equipment and Method: Stylet Placement Confirmation: ETT inserted through vocal cords under direct vision,  positive ETCO2 and breath sounds checked- equal and bilateral Secured at: 21 cm Tube secured with: Tape Dental Injury: Teeth and Oropharynx as per pre-operative assessment

## 2017-11-08 NOTE — Transfer of Care (Signed)
Immediate Anesthesia Transfer of Care Note  Patient: Jordan Chaney  Procedure(s) Performed: LEFT CAROTID ENDARTERECTOMY (Left Neck) PATCH ANGIOPLASTY USING XENOSURE BIOLOGIC PATCH 1cm x 6cm (Left Neck)  Patient Location: PACU  Anesthesia Type:General  Level of Consciousness: awake and patient cooperative  Airway & Oxygen Therapy: Patient Spontanous Breathing and Patient connected to nasal cannula oxygen  Post-op Assessment: Report given to RN and Post -op Vital signs reviewed and stable  Post vital signs: Reviewed and stable  Last Vitals:  Vitals:   11/08/17 0840  BP: 126/89  Pulse: 64  Resp: 20  Temp: 36.7 C  SpO2: 95%    Last Pain:  Vitals:   11/08/17 0840  TempSrc: Oral      Patients Stated Pain Goal: 3 (11/08/17 0840)  Complications: No apparent anesthesia complications

## 2017-11-08 NOTE — Progress Notes (Signed)
Patient arrived on the unit from PACU, assessment  completed see flow sheet, patient oriented to room and staff, bed in lowest position, call bell within reach will continue to monitor.

## 2017-11-08 NOTE — Interval H&P Note (Signed)
History and Physical Interval Note:  11/08/2017 9:54 AM  Jordan Chaney  has presented today for surgery, with the diagnosis of LEFT CAROTID STENOSIS  The various methods of treatment have been discussed with the patient and family. After consideration of risks, benefits and other options for treatment, the patient has consented to  Procedure(s): ENDARTERECTOMY CAROTID LEFT (Left) as a surgical intervention .  The patient's history has been reviewed, patient examined, no change in status, stable for surgery.  I have reviewed the patient's chart and labs.  Questions were answered to the patient's satisfaction.     Leonides SakeBrian Chen

## 2017-11-08 NOTE — Progress Notes (Signed)
Catheter drain found in bed with pt when RN was making rounds. Site assessed. Vitals stable. Neuro intact. Paged on call MD. Will continue to monitor.

## 2017-11-08 NOTE — Discharge Instructions (Signed)
   [facility]  Discharge Instructions   Carotid Endarterectomy (CEA)  Please refer to the following instructions for your post-procedure care. Your surgeon or physician assistant will discuss any changes with you.  Activity  You are encouraged to walk as much as you can. You can slowly return to normal activities but must avoid strenuous activity and heavy lifting until your doctor tell you it's OK. Avoid activities such as vacuuming or swinging a golf club. You can drive after one week if you are comfortable and you are no longer taking prescription pain medications. It is normal to feel tired for serval weeks after your surgery. It is also normal to have difficulty with sleep habits, eating, and bowel movements after surgery. These will go away with time.  Bathing/Showering  You may shower after you come home. Do not soak in a bathtub, hot tub, or swim until the incision heals completely.  Incision Care  Shower every day. Clean your incision with mild soap and water. Pat the area dry with a clean towel. You do not need a bandage unless otherwise instructed. Do not apply any ointments or creams to your incision. You may have skin glue on your incision. Do not peel it off. It will come off on its own in about one week. Your incision may feel thickened and raised for several weeks after your surgery. This is normal and the skin will soften over time. For Men Only: It's OK to shave around the incision but do not shave the incision itself for 2 weeks. It is common to have numbness under your chin that could last for several months.  Diet  Resume your normal diet. There are no special food restrictions following this procedure. A low fat/low cholesterol diet is recommended for all patients with vascular disease. In order to heal from your surgery, it is CRITICAL to get adequate nutrition. Your body requires vitamins, minerals, and protein. Vegetables are the best  source of vitamins and minerals. Vegetables also provide the perfect balance of protein. Processed food has little nutritional value, so try to avoid this.        Medications  Resume taking all of your medications unless your doctor or physician assistant tells you not to. If your incision is causing pain, you may take over-the- counter pain relievers such as acetaminophen (Tylenol). If you were prescribed a stronger pain medication, please be aware these medications can cause nausea and constipation. Prevent nausea by taking the medication with a snack or meal. Avoid constipation by drinking plenty of fluids and eating foods with a high amount of fiber, such as fruits, vegetables, and grains. Do not take Tylenol if you are taking prescription pain medications.  Follow Up  Our office will schedule a follow up appointment 2-3 weeks following discharge.  Please call us immediately for any of the following conditions  Increased pain, redness, drainage (pus) from your incision site. Fever of 101 degrees or higher. If you should develop stroke (slurred speech, difficulty swallowing, weakness on one side of your body, loss of vision) you should call 911 and go to the nearest emergency room.  Reduce your risk of vascular disease:  Stop smoking. If you would like help call [facility] at 1-800-QUIT-NOW (1-800-784-8669) or [place] at 336-586-4000. Manage your cholesterol Maintain a desired weight Control your diabetes Keep your blood pressure down  If you have any questions, please call the office at [phone].   

## 2017-11-08 NOTE — Op Note (Signed)
OPERATIVE NOTE  PROCEDURE:   1.  left carotid endarterectomy with bovine patch angioplasty 2.  left intraoperative carotid ultrasound  PRE-OPERATIVE DIAGNOSIS: left asymptomatic carotid stenosis >80%  POST-OPERATIVE DIAGNOSIS: same as above   SURGEON: Leonides Sake, MD  ASSISTANT(S): Lianne Cure, PAC   ANESTHESIA: general  ESTIMATED BLOOD LOSS: 250 cc  FINDING(S): 1.  Continuous Doppler audible flow signatures are appropriate for each carotid artery. 2.  No evidence of intimal flap visualized on transverse or longitudinal ultrasonography. 3.  Carotid plaque: necrotic core, heavily calcified 4.  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  Hypoglossal nerve: partially visualized, did not requiremobilization  SPECIMEN(S):  None  INDICATIONS:   Jordan Chaney is a 67 y.o. male who presents with left asymptomatic carotid stenosis >80%.  I discussed with the patient the risks, benefits, and alternatives to carotid endarterectomy.  I discussed the procedural details of carotid endarterectomy with the patient.  The patient is aware that the risks of carotid endarterectomy include but are not limited to: bleeding, infection, stroke, myocardial infarction, death, cranial nerve injuries both temporary and permanent, neck hematoma, possible airway compromise, labile blood pressure post-operatively, cerebral hyperperfusion syndrome, and possible need for additional interventions in the future. The patient is aware of the risks and agrees to proceed forward with the procedure.   DESCRIPTION: After full informed written consent was obtained from the patient, the patient was brought back to the operating room and placed supine upon the operating table.  Prior to induction, the patient received IV antibiotics.  After obtaining adequate anesthesia, the patient was placed into semi-Fowler position with a shoulder roll in place and the patient's neck slightly  hyperextended and rotated away from the surgical site.    The patient was prepped in the standard fashion for a left carotid endarterectomy.  I made an incision anterior to the sternocleidomastoid muscle and dissected down through the subcutaneous tissue.  The platysmas was opened with electrocautery.  Then I dissected down to the internal jugular vein.  This was dissected posteriorly until I obtained visualization of the common carotid artery.  This was dissected out and then an umbilical tape was placed around the common carotid artery and I loosely applied a Rumel tourniquet.  I then dissected in a periadventitial fashion along the common carotid artery up to the bifurcation.  I then identified the external carotid artery and the superior thyroid artery.  A 2-0 silk tie was looped around the superior thyroid artery, and I also dissected out the external carotid artery and placed a vessel loop around it.  In continuing the dissection to the internal carotid artery, I identified the facial vein.  This was ligated and then transected, giving me improved exposure of the internal carotid artery.  In the process of this dissection, the hypoglossal nerve was identified.  I then dissected out the internal carotid artery until I identified an area of soft tissue in the internal carotid artery.  I dissected slightly distal to this area, and placed an umbilical tape around the artery and loosely applied a Rumel tourniquet.  This position was behind the mandibular angle, so I had concerns that placement of a shunt would be difficult, so I elected to get a back pressure.  The patient was given 11000 units of Heparin intravenously, which was a therapeutic bolus. An additional 2000 units of Heparin was administered every hour after initial bolus to maintain anticoagulation.  In total, 11914 units of  Heparin was administrated to achieve and maintain a therapeutic level of anticoagulation.  After waiting 3 minutes, then I  clamped the external carotid artery and then the common carotid artery.  I cannulated the common carotid artery with a butterfly needle distal to the clamp and connected the needle to the arterial line circuit.  A MAP of only 20 mm Hg was measured, so I felt that this was not adequate.  I removed the butterfly needle and then clamped the internal carotid artery   I then made an arteriotomy in the common carotid artery with a 11 blade, and extended the arteriotomy with a Potts scissor down into the common carotid artery, then I carried the arteriotomy through the bifurcation into the internal carotid artery until I reached an area that was not diseased.  This was 2 cm more proximal than my dissection distally.  At this point, I took the 10 shunt that previously been prepared and I inserted it into the internal carotid artery.  The Rumel tourniquet was not applied, as there appeared to be a good fit between the artery and the shunt.  I unclamped the shunt to verify retrograde blood flow in the internal carotid artery.  I then placed the other end of the shunt into the common carotid artery after unclamping the artery.  The Rumel was tightened down around the shunt.  At this point, I verified blood flow in the shunt with a continuous doppler.  There appeared to be some backbleeding around the shunt in the internal carotid artery so I elected to use the shunt clamp as the Rumel would have difficulties more proximal.  Proximally there was bleeding around the shunt due to plaque.  I extended the skin incision and then dissected more proximal on the common carotid artery.  I place a new umbilical tape around the common carotid artery and applied a new Rumel tourniquet.  I removed the prior Rumel tourniquet on the common carotid artery.  At this point, I started the endarterectomy in the common carotid artery with a Cytogeneticistenfield dissector and carried this dissection down into the common carotid artery circumferentially.   Then I transected the plaque at a segment where it was adherent.  I then carried this dissection up into the external carotid artery.  The plaque was extracted by unclamping the external carotid artery and everting the artery.  The dissection was then carried into the internal carotid artery, extracting the remaining portion of the carotid plaque.  I passed the plaque off the field as a specimen.    I then spent the next 30 minutes removing intimal flaps and loose debris.  Eventually I reached the point where the residual plaque was densely adherent and any further dissection would compromise the integrity of the wall.  After verifying that there was no more loose intimal flaps or debris, I re-interrogated the entirety of this carotid artery.  At this point, I was satisfied that the minimal remaining disease was densely adherent to the wall and wall integrity was intact.  At this point, I then fashioned a bovine pericardial patch for the geometry of this artery and sewed it in place with two running stitch of 6-0 Prolene, one from each end.  Prior to completing this patch angioplasty, I removed the shunt first from the internal carotid artery, from which there was excellent backbleeding, and clamped it.  Then I removed the shunt from the common carotid artery, from which there was excellent antegrade bleeding, and then  clamped it.  At this point, I allowed the external carotid artery to backbleed, which was excellent.  Then I instilled heparinized saline in this patched artery and then completed the patch angioplasty in the usual fashion.    At this point, I first released the clamp on the external carotid artery, then I released it on the common carotid artery.  After waiting a few seconds, I then released it on the internal carotid artery.  I had to repair a loose suture line in the distal internal carotid artery with a 6-0 Prolene.  I then repaired a few bleeding points in the patch line with 6-0 Prolene  stitches.  No further bleeding from the suture line was noted.  I then interrogated this patient's arteries with the continuous Doppler.  The audible waveforms in each artery were consistent with the expected characteristics for each artery.  The Sonosite probe was then sterilely draped and used to interrogate the carotid artery in both longitudinal and transverse views.  At this point, I washed out the wound, and placed Avitene throughout.  I also gave the patient 60 mg of protamine to reverse his anticoagulation.   After waiting a few minutes, I removed the Avitene and washed out the wound.    There was no more active bleeding in the surgical site but there was a diffuse ooze in the surgical site due to the dissection of the common carotid artery, deep in the neck.  I felt placement of a drain was necessary.  I passed the TLS trocar through the subcutaneous tissue inferior to the incision line.  I pulled the drain to appropriate position and secured it with a 3-0 Nylon suture tied to the drain.  I shortened the drain to appropriate length and place it adjacent to the artery and nerve.  The exterior hardware was attached to the drain and the test tube was not attached until the skin was close.    I then reapproximated the platysma muscle with a running stitch of 3-0 Vicryl.  The skin was then reapproximated with a running subcuticular 4-0 Monocryl stitch.  The skin was then cleaned, dried and LiquidBand was used to reinforce the skin closure.    The patient woke without any problems, neurologically intact.    COMPLICATIONS: none  CONDITION: stable   Leonides Sake, MD, [facility] [place] Office: [phone] Pager: [phone]  11/08/2017, 1:29 PM

## 2017-11-09 ENCOUNTER — Other Ambulatory Visit: Payer: Self-pay

## 2017-11-09 ENCOUNTER — Encounter (HOSPITAL_COMMUNITY): Payer: Self-pay | Admitting: Nurse Practitioner

## 2017-11-09 ENCOUNTER — Telehealth: Payer: Self-pay | Admitting: Vascular Surgery

## 2017-11-09 LAB — BASIC METABOLIC PANEL
Anion gap: 9 (ref 5–15)
BUN: 14 mg/dL (ref 6–20)
CALCIUM: 8.8 mg/dL — AB (ref 8.9–10.3)
CHLORIDE: 104 mmol/L (ref 101–111)
CO2: 24 mmol/L (ref 22–32)
CREATININE: 0.82 mg/dL (ref 0.61–1.24)
GFR calc non Af Amer: >60 mL/min (ref >60)
GLUCOSE: 180 mg/dL — AB (ref 65–99)
Potassium: 3.6 mmol/L (ref 3.5–5.1)
Sodium: 137 mmol/L (ref 135–145)

## 2017-11-09 LAB — CBC
HCT: 36 % — ABNORMAL LOW (ref 39.0–52.0)
Hemoglobin: 12.3 g/dL — ABNORMAL LOW (ref 13.0–17.0)
MCH: 32.2 pg (ref 26.0–34.0)
MCHC: 34.2 g/dL (ref 30.0–36.0)
MCV: 94.2 fL (ref 78.0–100.0)
PLATELETS: 197 10*3/uL (ref 150–400)
RBC: 3.82 MIL/uL — ABNORMAL LOW (ref 4.22–5.81)
RDW: 12.9 % (ref 11.5–15.5)
WBC: 11 10*3/uL — ABNORMAL HIGH (ref 4.0–10.5)

## 2017-11-09 MED ORDER — OXYCODONE-ACETAMINOPHEN 5-325 MG PO TABS: 1.0000 | ORAL_TABLET | Freq: Four times a day (QID) | ORAL | 0 refills | Status: DC | PRN

## 2017-11-09 NOTE — Progress Notes (Addendum)
[facility] [place]  Subjective  - doing well over all, hoarseness to voice.    Objective 118/73 70 97.8 F (36.6 C) (Oral) 18 95%  Intake/Output Summary (Last 24 hours) at 11/09/2017 0721 Last data filed at 11/09/2017 0700 Gross per 24 hour  Intake 3040 ml  Output 1762 ml  Net 1278 ml    Palpable radial pulses, active motion in all four extremities with 5/5 strength. 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  If this continues we will refer him to an ENT. He will continue a daily 81 mg aspirin.   F/U with Dr. Imogene Burnhen in 2 weeks.  Mosetta Pigeonmma Maureen Collins 11/09/2017 7:21 AM --  Laboratory Lab Results: Recent Labs    11/09/17 0528  WBC 11.0*  HGB 12.3*  HCT 36.0*  PLT 197   BMET Recent Labs    11/09/17 0528  NA 137  K 3.6  CL 104  CO2 24  GLUCOSE 180*  BUN 14  CREATININE 0.82  CALCIUM 8.8*    COAG Lab Results  Component Value Date   INR 0.96 10/26/2017   No results found for: PTT  Addendum  I have independently interviewed and examined the patient, and I agree with the physician assistant's findings.  No hematoma.  BP stable.  Neuro intact.  Follow up in office in 2 weeks  Leonides SakeBrian Chen, MD, [facility] [place] Office: [phone] Pager: [phone]  11/09/2017, 8:30 AM

## 2017-11-09 NOTE — Telephone Encounter (Signed)
-----   Message from Sharee PimpleMarilyn K McChesney, RN sent at 11/08/2017  2:39 PM EST ----- Regarding: 2-3 weeks per Dr. Imogene Burnhen on another message   ----- Message ----- From: Lars Mageollins, Emma M, PA-C Sent: 11/08/2017   1:56 PM To: Vvs Charge Pool  F/U with Dr. Imogene Burnhen s/p left CEA no study needed.

## 2017-11-09 NOTE — Telephone Encounter (Signed)
Sched appt 11/30/17 at 3:45. Spoke to pt to inform them of appt.

## 2017-11-09 NOTE — Progress Notes (Signed)
Patient ambulated 97240ft in the hall using front wheel walker, ambulation well tolerated, will continue to monitor.

## 2017-11-09 NOTE — Care Management Note (Signed)
Case Management Note Donn PieriniKristi Mabrey Howland RN, BSN Unit 4E-Case Manager [phone]  Patient Details  Name: Jordan Chaney MRN: [id number] Date of Birth: 07-Jan-1951  Subjective/Objective:  Pt admitted s/p CEA                  Action/Plan: PTA pt lived at home- plan to return home, no CM needs noted for transition home  Expected Discharge Date:  11/09/17               Expected Discharge Plan:  Home/Self Care  In-House Referral:  NA  Discharge planning Services  CM Consult, NA  Post Acute Care Choice:    Choice offered to:     DME Arranged:    DME Agency:     HH Arranged:    HH Agency:     Status of Service:  Completed, signed off  If discussed at Long Length of Stay Meetings, dates discussed:    Discharge Disposition: home/self care  Additional Comments:  Darrold SpanWebster, Britlee Skolnik Hall, RN 11/09/2017, 11:38 AM

## 2017-11-10 NOTE — Anesthesia Postprocedure Evaluation (Signed)
Anesthesia Post Note  Patient: Jordan FordyceRoger Dale Rote  Procedure(s) Performed: LEFT CAROTID ENDARTERECTOMY (Left Neck) PATCH ANGIOPLASTY USING XENOSURE BIOLOGIC PATCH 1cm x 6cm (Left Neck)     Patient location during evaluation: PACU Anesthesia Type: General Level of consciousness: awake and alert Pain management: pain level controlled Vital Signs Assessment: post-procedure vital signs reviewed and stable Respiratory status: spontaneous breathing, nonlabored ventilation, respiratory function stable and patient connected to nasal cannula oxygen Cardiovascular status: blood pressure returned to baseline and stable Postop Assessment: no apparent nausea or vomiting Anesthetic complications: no    Last Vitals:  Vitals:   11/09/17 0400 11/09/17 0751  BP: 118/73 129/63  Pulse:    Resp: 18 (!) 21  Temp:  36.5 C  SpO2: 95% 97%    Last Pain:  Vitals:   11/09/17 0751  TempSrc: Axillary  PainSc: 2                  Ramonte Mena
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
## 2017-11-17 ENCOUNTER — Telehealth: Payer: Self-pay | Admitting: *Deleted

## 2017-11-17 NOTE — Telephone Encounter (Signed)
Patient called c/o" 2 small knots at Left neck incision that have been there since surgery". Denies redness, swelling or heat. States area is purple. Denies any trouble swallowing or breathing. Agreeable to see PA on 11/18/17. Time given. States his blood pressure has been up. I instructed him to hang up from me and call his PCP now.

## 2017-11-18 ENCOUNTER — Other Ambulatory Visit: Payer: Self-pay

## 2017-11-18 ENCOUNTER — Ambulatory Visit (INDEPENDENT_AMBULATORY_CARE_PROVIDER_SITE_OTHER): Payer: Medicare Other | Admitting: Vascular Surgery

## 2017-11-18 ENCOUNTER — Encounter: Payer: Self-pay | Admitting: Vascular Surgery

## 2017-11-18 VITALS — BP 160/101 | HR 77 | Temp 97.8°F | Resp 20 | Ht 69.0 in | Wt 251.7 lb

## 2017-11-18 DIAGNOSIS — I6523 Occlusion and stenosis of bilateral carotid arteries: Secondary | ICD-10-CM

## 2017-11-18 NOTE — Progress Notes (Signed)
    Postoperative Visit   History of Present Illness   Jordan Chaney is a 67 y.o. male who presents for postoperative follow-up for: left CEA (Date: 11/08/17).  The patient's neck incision is healing well.  He however feels two knots under the skin of the incision.  Patient also continues to have a hoarse voice however this is improving per patient.  The patient has not had any stroke or TIA symptoms including slurring speech, changes in vision, or one sided weakness.  He is taking aspirin and statin daily.  Current Outpatient Medications  Medication Sig Dispense Refill  . allopurinol (ZYLOPRIM) 100 MG tablet Take 100 mg by mouth daily.      Marland Kitchen. aspirin 81 MG tablet Take 81 mg by mouth daily.      . Aspirin-Acetaminophen-Caffeine (GOODY HEADACHE PO) Take 1 packet by mouth daily as needed (headaches).    Marland Kitchen. atorvastatin (LIPITOR) 40 MG tablet Take 40 mg by mouth daily.    . Cholecalciferol (VITAMIN D) 2000 units CAPS Take 2,000 Units by mouth daily.    . hydrochlorothiazide (HYDRODIURIL) 25 MG tablet Take 25 mg by mouth daily.    Marland Kitchen. lisinopril (PRINIVIL,ZESTRIL) 20 MG tablet Take 20 mg by mouth daily.    . meloxicam (MOBIC) 15 MG tablet Take 15 mg by mouth daily.    . metoprolol succinate (TOPROL-XL) 25 MG 24 hr tablet Take 25 mg by mouth daily.    Marland Kitchen. omeprazole (PRILOSEC) 40 MG capsule Take 40 mg by mouth daily.    Marland Kitchen. oxyCODONE-acetaminophen (PERCOCET/ROXICET) 5-325 MG tablet Take 1 tablet by mouth every 6 (six) hours as needed for moderate pain. 30 tablet 0   No current facility-administered medications for this visit.     For VQI Use Only   PRE-ADM LIVING: Home  AMB STATUS: Ambulatory   Physical Examination   Vitals:   11/18/17 1311 11/18/17 1316  BP: (!) 167/101 (!) 160/101  Pulse: 77   Resp: 20   Temp: 97.8 F (36.6 C)   TempSrc: Oral   SpO2: 98%   Weight: 251 lb 11.2 oz (114.2 kg)   Height: 5\' 9"  (1.753 m)     left Neck: Incision is healing well with small, soft,  mobile, fluid collection under proximal incision and another under natural crease in neck at distal incision; no drainage or sign of infection; trachea midline  Neuro: CN 2-12 are intact,  Motor strength is 5/5  bilaterally, sensation is  grossly intact   Medical Decision Making   Jordan Chaney is a 67 y.o. male who presents s/p left CEA.   The patient's neck incision is healing well with likely 2 small fluid collections; patient was re-assured this is common and they will likely resolve spontaneously  Post operatively he is asymptomatic without stroke symptoms. The patient is currently on a statin: Lipitor.  The patient is currently on an anti-platelet: ASA. The patient's surveillance will included routine carotid duplex studies which will be completed in: 5 months, at which time the patient will be re-evaluated.   The patient agrees to participate in their maximal medical care and routine surveillance.  We will also hold off on referral to ENT at this time; patient believes his voice has improved drastically since surgery  Emilie RutterMatthew Renaldo Gornick, PA-C [facility] [place] Office: [phone]

## 2017-11-28 NOTE — Progress Notes (Signed)
Postoperative Visit   History of Present Illness   Jordan Chaney is a 67 y.o. male who presents for postoperative follow-up for: left CEA (Date: 11/08/17).  The patient's neck incision is healed.  The patient has had no stroke or TIA symptoms.  R ICA was 50-60% on CTA neck.  Pt has had some hoarseness that is improving.  Pt notes his BP has been normal off all medications.  Current Outpatient Medications  Medication Sig Dispense Refill  . allopurinol (ZYLOPRIM) 100 MG tablet Take 100 mg by mouth daily.      Marland Kitchen aspirin 81 MG tablet Take 81 mg by mouth daily.      . Aspirin-Acetaminophen-Caffeine (GOODY HEADACHE PO) Take 1 packet by mouth daily as needed (headaches).    Marland Kitchen atorvastatin (LIPITOR) 40 MG tablet Take 40 mg by mouth daily.    . Cholecalciferol (VITAMIN D) 2000 units CAPS Take 2,000 Units by mouth daily.    . hydrochlorothiazide (HYDRODIURIL) 25 MG tablet Take 25 mg by mouth daily.    Marland Kitchen lisinopril (PRINIVIL,ZESTRIL) 20 MG tablet Take 20 mg by mouth daily.    . meloxicam (MOBIC) 15 MG tablet Take 15 mg by mouth daily.    . metoprolol succinate (TOPROL-XL) 25 MG 24 hr tablet Take 25 mg by mouth daily.    Marland Kitchen omeprazole (PRILOSEC) 40 MG capsule Take 40 mg by mouth daily.    Marland Kitchen oxyCODONE-acetaminophen (PERCOCET/ROXICET) 5-325 MG tablet Take 1 tablet by mouth every 6 (six) hours as needed for moderate pain. 30 tablet 0   No current facility-administered medications for this visit.     For VQI Use Only   PRE-ADM LIVING: Home  AMB STATUS: Ambulatory   Physical Examination   Vitals:   11/30/17 1536 11/30/17 1538  BP: 132/80 133/88  Pulse: 60   Resp: 18   Temp: (!) 97.3 F (36.3 C)   TempSrc: Oral   SpO2: 97%   Weight: 251 lb (113.9 kg)   Height: 5\' 9"  (1.753 m)     left Neck: Incision is healed  Neuro: CN 2-12 are intact , raspy voice, Motor strength is 5/5 bilaterally, sensation is grossly intact   Medical Decision Making   Jordan Chaney is a 67 y.o. male  who presents s/p left CEA, improving hoarseness, possible s/p CEA normotension   From the viewpoint of his knee, this patient can proceed with knee surgery with decreased risk of CVA.  Patient's BP appears to be normalizing off anti-HTN rx.  I have had two cases of resolution of HTN after CEA, so this patient may be in the process of such.  I recommended logging BP twice a day and bringing his BP results to his PCP for further titration of his anti-HTN regimen.  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  No transection is suspected.  The patient also has a history of severe GERD which also result in hoarseness.  Will have the patient follow up in 3 months.  If his hoarseness has not resolved, will refer him to ENT at that time  The patient's neck incision is healing with no stroke symptoms. I discussed in depth with the patient the nature of atherosclerosis, and emphasized the importance of maximal medical management including strict control of blood pressure, blood glucose, and lipid levels, obtaining regular exercise, anti-platelet use and cessation of smoking.   The patient is  currently on a statin: Lipitor.  The patient is currently on an anti-platelet: ASA. The patient is aware that without maximal medical management the underlying atherosclerotic disease process will progress, limiting the benefit of any interventions. The patient's surveillance will included routine carotid duplex studies which will be completed in: 9 months, at which time the patient will be re-evaluated.   I emphasized the importance of routine surveillance of the carotid arteries as recurrence of stenosis is possible, especially with proper management of underlying atherosclerotic disease. The patient agrees to participate in their maximal medical care and routine surveillance.  Thank you for allowing us to participate in this  patient's care.  Leonides SakeBrian Arlyce Circle, MD, FACS [facility] [place] Office: [phone] Pager: [phone]

## 2017-11-30 ENCOUNTER — Encounter: Payer: Self-pay | Admitting: Vascular Surgery

## 2017-11-30 ENCOUNTER — Ambulatory Visit (INDEPENDENT_AMBULATORY_CARE_PROVIDER_SITE_OTHER): Payer: Medicare Other | Admitting: Vascular Surgery

## 2017-11-30 VITALS — BP 133/88 | HR 60 | Temp 97.3°F | Resp 18 | Ht 69.0 in | Wt 251.0 lb

## 2017-11-30 DIAGNOSIS — I6523 Occlusion and stenosis of bilateral carotid arteries: Secondary | ICD-10-CM

## 2017-12-09 ENCOUNTER — Encounter: Payer: Medicare Other | Admitting: Vascular Surgery

## 2018-03-06 NOTE — Progress Notes (Signed)
    Postoperative Visit   History of Present Illness   Jordan Chaney is a 67 y.o. male who presents for postoperative follow-up for: left CEA (Date: 11/30/17).  The patient presents today to re-eval hoarseness.  The plan to to refer to ENT if hoarseness had not resolved.   Current Outpatient Medications  Medication Sig Dispense Refill  . allopurinol (ZYLOPRIM) 100 MG tablet Take 100 mg by mouth daily.      Marland Kitchen. aspirin 81 MG tablet Take 81 mg by mouth daily.      . Aspirin-Acetaminophen-Caffeine (GOODY HEADACHE PO) Take 1 packet by mouth daily as needed (headaches).    Marland Kitchen. atorvastatin (LIPITOR) 40 MG tablet Take 40 mg by mouth daily.    . Cholecalciferol (VITAMIN D) 2000 units CAPS Take 2,000 Units by mouth daily.    . hydrochlorothiazide (HYDRODIURIL) 25 MG tablet Take 25 mg by mouth daily.    Marland Kitchen. lisinopril (PRINIVIL,ZESTRIL) 20 MG tablet Take 20 mg by mouth daily.    . meloxicam (MOBIC) 15 MG tablet Take 15 mg by mouth daily.    . metoprolol succinate (TOPROL-XL) 25 MG 24 hr tablet Take 25 mg by mouth daily.    Marland Kitchen. omeprazole (PRILOSEC) 40 MG capsule Take 40 mg by mouth daily.    Marland Kitchen. oxyCODONE-acetaminophen (PERCOCET/ROXICET) 5-325 MG tablet Take 1 tablet by mouth every 6 (six) hours as needed for moderate pain. 30 tablet 0   No current facility-administered medications for this visit.     For VQI Use Only   PRE-ADM LIVING: Home  AMB STATUS: Ambulatory   Physical Examination   Vitals:   03/08/18 0921  BP: 120/70  Pulse: (!) 59  Temp: 97.8 F (36.6 C)  TempSrc: Oral  SpO2: 96%  Weight: 248 lb (112.5 kg)  Height: 5\' 9"  (1.753 m)    left Neck: Incision is healed  Neuro: CN 2-12 are intact , Motor strength is 5/5  bilaterally, sensation is grossly intact, hoarse voice   Medical Decision Making   Jordan Chaney is a 67 y.o. male who presents s/p left CEA.  Patient is asx except continued raspy voice.  I am sending him to ENT for further evaluation of vocal cord  position. I discussed in depth with the patient the nature of atherosclerosis, and emphasized the importance of maximal medical management including strict control of blood pressure, blood glucose, and lipid levels, obtaining regular exercise, anti-platelet use and cessation of smoking.   The patient is currently on a statin: Lipitor.  The patient is currently on an anti-platelet: ASA. The patient is aware that without maximal medical management the underlying atherosclerotic disease process will progress, limiting the benefit of any interventions. The patient's surveillance will included routine carotid duplex studies which will be completed in: 3 months, at which time the patient will be re-evaluated.   I emphasized the importance of routine surveillance of the carotid arteries as recurrence of stenosis is possible, especially with proper management of underlying atherosclerotic disease. The patient agrees to participate in their maximal medical care and routine surveillance.  Thank you for allowing us to participate in this patient's care.  Leonides SakeBrian Kniyah Khun, MD, FACS [facility] [place] Office: [phone] Pager: [phone]

## 2018-03-08 ENCOUNTER — Encounter: Payer: Self-pay | Admitting: Vascular Surgery

## 2018-03-08 ENCOUNTER — Ambulatory Visit (INDEPENDENT_AMBULATORY_CARE_PROVIDER_SITE_OTHER): Payer: Medicare Other | Admitting: Vascular Surgery

## 2018-03-08 VITALS — BP 120/70 | HR 59 | Temp 97.8°F | Ht 69.0 in | Wt 248.0 lb

## 2018-03-08 DIAGNOSIS — I6523 Occlusion and stenosis of bilateral carotid arteries: Secondary | ICD-10-CM

## 2018-03-28 ENCOUNTER — Other Ambulatory Visit: Payer: Self-pay | Admitting: Otolaryngology

## 2018-04-19 NOTE — Pre-Procedure Instructions (Signed)
Noland FordyceRoger Dale Ogden  04/19/2018      [id number] [place], [address] [place] - [address] [facility] [address] [facility] [id number] Phone: [phone] Fax: [phone]    Your procedure is scheduled on Friday August 16.  [facility] at 7:00 A.M.  Call this number if you have problems the morning of surgery:  [phone]   Remember:  Do not eat or drink after midnight.    Take these medicines the morning of surgery with A SIP OF WATER:   Metoprolol (toprol-XL) Omeprazole (pirlsoec) Allopurinol (Zyloprim) Hydrocodone-acetaminophen (NORCO) if needed  7 days prior to surgery STOP taking any Aleve, Naproxen, Ibuprofen, Motrin, Advil, Goody's, BC's, all herbal medications, fish oil, and all vitamins, meloxicam (mobic)  FOLLOW your surgeon's instructions on stopping Aspirin.       Do not wear jewelry  Do not wear lotions, powders, or colognes, or deodorant.  Do not shave 48 hours prior to surgery.  Men may shave face and neck.  Do not bring valuables to the hospital.  [facility] is not responsible for any belongings or valuables.  Contacts, dentures or bridgework may not be worn into surgery.  Leave your suitcase in the car.  After surgery it may be brought to your room.  For patients admitted to the hospital, discharge time will be determined by your treatment team.  Patients discharged the day of surgery will not be allowed to drive home.   Special instructions:    [place]- Preparing For Surgery  Before surgery, you can play an important role. Because skin is not sterile, your skin needs to be as free of germs as possible. You can reduce the number of germs on your skin by washing with CHG (chlorahexidine gluconate) Soap before surgery.  CHG is an antiseptic cleaner which kills germs and bonds with the skin to continue killing germs even after washing.    Oral Hygiene is also important to  reduce your risk of infection.  Remember - BRUSH YOUR TEETH THE MORNING OF SURGERY WITH YOUR REGULAR TOOTHPASTE  Please do not use if you have an allergy to CHG or antibacterial soaps. If your skin becomes reddened/irritated stop using the CHG.  Do not shave (including legs and underarms) for at least 48 hours prior to first CHG shower. It is OK to shave your face.  Please follow these instructions carefully.   1. Shower the NIGHT BEFORE SURGERY and the MORNING OF SURGERY with CHG.   2. If you chose to wash your hair, wash your hair first as usual with your normal shampoo.  3. After you shampoo, rinse your hair and body thoroughly to remove the shampoo.  4. Use CHG as you would any other liquid soap. You can apply CHG directly to the skin and wash gently with a scrungie or a clean washcloth.   5. Apply the CHG Soap to your body ONLY FROM THE NECK DOWN.  Do not use on open wounds or open sores. Avoid contact with your eyes, ears, mouth and genitals (private parts). Wash Face and genitals (private parts)  with your normal soap.  6. Wash thoroughly, paying special attention to the area where your surgery will be performed.  7. Thoroughly rinse your body with warm water from the neck down.  8. DO NOT shower/wash with your normal soap after using and rinsing off the CHG Soap.  9. Pat yourself dry with a  CLEAN TOWEL.  10. Wear CLEAN PAJAMAS to bed the night before surgery, wear comfortable clothes the morning of surgery  11. Place CLEAN SHEETS on your bed the night of your first shower and DO NOT SLEEP WITH PETS.    Day of Surgery:  Do not apply any deodorants/lotions.  Please wear clean clothes to the hospital/surgery center.   Remember to brush your teeth WITH YOUR REGULAR TOOTHPASTE.    Please read over the following fact sheets that you were given. Coughing and Deep Breathing and Surgical Site Infection Prevention

## 2018-04-20 ENCOUNTER — Encounter (HOSPITAL_COMMUNITY): Payer: Self-pay

## 2018-04-20 ENCOUNTER — Other Ambulatory Visit: Payer: Self-pay

## 2018-04-20 ENCOUNTER — Encounter (HOSPITAL_COMMUNITY)
Admission: RE | Admit: 2018-04-20 | Discharge: 2018-04-20 | Disposition: A | Discharge status: Home / Self Care | Payer: Medicare Other | Source: Ambulatory Visit | Attending: Otolaryngology | Admitting: Otolaryngology

## 2018-04-20 DIAGNOSIS — M109 Gout, unspecified: Secondary | ICD-10-CM | POA: Diagnosis not present

## 2018-04-20 DIAGNOSIS — Z01812 Encounter for preprocedural laboratory examination: Secondary | ICD-10-CM | POA: Diagnosis not present

## 2018-04-20 DIAGNOSIS — Z951 Presence of aortocoronary bypass graft: Secondary | ICD-10-CM | POA: Insufficient documentation

## 2018-04-20 DIAGNOSIS — K219 Gastro-esophageal reflux disease without esophagitis: Secondary | ICD-10-CM | POA: Diagnosis not present

## 2018-04-20 DIAGNOSIS — Z87891 Personal history of nicotine dependence: Secondary | ICD-10-CM | POA: Diagnosis not present

## 2018-04-20 DIAGNOSIS — I2510 Atherosclerotic heart disease of native coronary artery without angina pectoris: Secondary | ICD-10-CM | POA: Diagnosis not present

## 2018-04-20 DIAGNOSIS — Z79899 Other long term (current) drug therapy: Secondary | ICD-10-CM | POA: Diagnosis not present

## 2018-04-20 DIAGNOSIS — I350 Nonrheumatic aortic (valve) stenosis: Secondary | ICD-10-CM | POA: Diagnosis not present

## 2018-04-20 DIAGNOSIS — R490 Dysphonia: Secondary | ICD-10-CM | POA: Diagnosis present

## 2018-04-20 DIAGNOSIS — E7800 Pure hypercholesterolemia, unspecified: Secondary | ICD-10-CM | POA: Diagnosis not present

## 2018-04-20 DIAGNOSIS — J383 Other diseases of vocal cords: Secondary | ICD-10-CM | POA: Diagnosis not present

## 2018-04-20 DIAGNOSIS — I10 Essential (primary) hypertension: Secondary | ICD-10-CM | POA: Diagnosis not present

## 2018-04-20 DIAGNOSIS — Z7982 Long term (current) use of aspirin: Secondary | ICD-10-CM | POA: Diagnosis not present

## 2018-04-20 LAB — CBC
HEMATOCRIT: 45.9 % (ref 39.0–52.0)
HEMOGLOBIN: 15 g/dL (ref 13.0–17.0)
MCH: 31.1 pg (ref 26.0–34.0)
MCHC: 32.7 g/dL (ref 30.0–36.0)
MCV: 95.2 fL (ref 78.0–100.0)
Platelets: 207 10*3/uL (ref 150–400)
RBC: 4.82 MIL/uL (ref 4.22–5.81)
RDW: 12.7 % (ref 11.5–15.5)
WBC: 8.5 10*3/uL (ref 4.0–10.5)

## 2018-04-20 LAB — BASIC METABOLIC PANEL
ANION GAP: 10 (ref 5–15)
BUN: 17 mg/dL (ref 8–23)
CHLORIDE: 101 mmol/L (ref 98–111)
CO2: 26 mmol/L (ref 22–32)
Calcium: 9.3 mg/dL (ref 8.9–10.3)
Creatinine, Ser: 0.85 mg/dL (ref 0.61–1.24)
GFR calc non Af Amer: >60 mL/min (ref >60)
GLUCOSE: 221 mg/dL — AB (ref 70–99)
POTASSIUM: 3.5 mmol/L (ref 3.5–5.1)
Sodium: 137 mmol/L (ref 135–145)

## 2018-04-20 NOTE — Progress Notes (Addendum)
PCP: Barney DrainMoogali Arvind, MD  [facility]  EKG: 11/08/17 in EPIC  Stress test: 10/11/17 in EPIC-media tab  [facility]  Cardiac Cath: prior to open heart surgery 7 years ago  [facility]

## 2018-04-21 LAB — HEMOGLOBIN A1C
Hgb A1c MFr Bld: 7.3 % — ABNORMAL HIGH (ref 4.8–5.6)
Mean Plasma Glucose: 162.81 mg/dL
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
## 2018-04-25 NOTE — Progress Notes (Signed)
Anesthesia follow-up: See previous note by Rica MastKabbe, Angela, NP. Patient was referred back to his PCP for new DM2 diagnosis. Labs were forwarded to his PCP Arvind, Idelia SalmMoogali M, MD [facility] for review. Patient also called me and reported that he saw Dr. Kathrynn SpeedArvind on 04/24/18 (note currently unavailable) and was told that he could still proceed with surgery but may require insulin as needed. Dr. Kathrynn SpeedArvind will continue to follow-up him on an out-patient basis. I also received [facility] that reads "OK for surgery." Repeat labs from 04/24/18 also included showing A1c there was 7.7% with glucose of 98. Patient will need a fasting CBG on the day of surgery.   Velna Ochsllison Reis Goga, PA-C [facility]esthesiology Phone [phone] 04/25/2018 5:50 PM

## 2018-04-28 ENCOUNTER — Encounter (HOSPITAL_COMMUNITY): Payer: Self-pay | Admitting: Certified Registered Nurse Anesthetist

## 2018-04-28 ENCOUNTER — Ambulatory Visit (HOSPITAL_COMMUNITY): Payer: Medicare Other | Admitting: Emergency Medicine

## 2018-04-28 ENCOUNTER — Encounter (HOSPITAL_COMMUNITY)
Admission: RE | Disposition: A | Discharge status: Home / Self Care | Payer: Self-pay | Source: Ambulatory Visit | Attending: Otolaryngology

## 2018-04-28 ENCOUNTER — Ambulatory Visit (HOSPITAL_COMMUNITY)
Admission: RE | Admit: 2018-04-28 | Discharge: 2018-04-28 | Disposition: A | Discharge status: Home / Self Care | Payer: Medicare Other | Source: Ambulatory Visit | Attending: Otolaryngology | Admitting: Otolaryngology

## 2018-04-28 DIAGNOSIS — E7800 Pure hypercholesterolemia, unspecified: Secondary | ICD-10-CM | POA: Insufficient documentation

## 2018-04-28 DIAGNOSIS — I10 Essential (primary) hypertension: Secondary | ICD-10-CM | POA: Diagnosis not present

## 2018-04-28 DIAGNOSIS — J383 Other diseases of vocal cords: Secondary | ICD-10-CM | POA: Diagnosis not present

## 2018-04-28 DIAGNOSIS — Z951 Presence of aortocoronary bypass graft: Secondary | ICD-10-CM | POA: Insufficient documentation

## 2018-04-28 DIAGNOSIS — M109 Gout, unspecified: Secondary | ICD-10-CM | POA: Insufficient documentation

## 2018-04-28 DIAGNOSIS — R490 Dysphonia: Secondary | ICD-10-CM | POA: Diagnosis not present

## 2018-04-28 DIAGNOSIS — Z7982 Long term (current) use of aspirin: Secondary | ICD-10-CM | POA: Insufficient documentation

## 2018-04-28 DIAGNOSIS — Z87891 Personal history of nicotine dependence: Secondary | ICD-10-CM | POA: Insufficient documentation

## 2018-04-28 DIAGNOSIS — I2510 Atherosclerotic heart disease of native coronary artery without angina pectoris: Secondary | ICD-10-CM | POA: Insufficient documentation

## 2018-04-28 DIAGNOSIS — Z79899 Other long term (current) drug therapy: Secondary | ICD-10-CM | POA: Insufficient documentation

## 2018-04-28 DIAGNOSIS — K219 Gastro-esophageal reflux disease without esophagitis: Secondary | ICD-10-CM | POA: Insufficient documentation

## 2018-04-28 DIAGNOSIS — I350 Nonrheumatic aortic (valve) stenosis: Secondary | ICD-10-CM | POA: Insufficient documentation

## 2018-04-28 HISTORY — PX: MICROLARYNGOSCOPY W/VOCAL CORD INJECTION: SHX2665

## 2018-04-28 LAB — GLUCOSE, CAPILLARY: Glucose-Capillary: 135 mg/dL — ABNORMAL HIGH (ref 70–99)

## 2018-04-28 SURGERY — MICROLARYNGOSCOPY, WITH VOCAL CORD INJECTION
Anesthesia: General | Site: Throat

## 2018-04-28 MED ORDER — SODIUM CHLORIDE 0.9 % IV SOLN
0.0125 ug/kg/min | INTRAVENOUS | Status: DC
  Administered 2018-04-28: 2 ug/kg/min via INTRAVENOUS
  Filled 2018-04-28 (×2): qty 2000

## 2018-04-28 MED ORDER — MIDAZOLAM HCL 2 MG/2ML IJ SOLN
INTRAMUSCULAR | Status: AC
  Filled 2018-04-28: qty 2

## 2018-04-28 MED ORDER — MIDAZOLAM HCL 2 MG/2ML IJ SOLN
INTRAMUSCULAR | Status: DC | PRN
  Administered 2018-04-28: 2 mg via INTRAVENOUS

## 2018-04-28 MED ORDER — SODIUM CHLORIDE 0.9 % IV SOLN
INTRAVENOUS | Status: DC | PRN
  Administered 2018-04-28: 20 ug/min via INTRAVENOUS

## 2018-04-28 MED ORDER — TRIAMCINOLONE ACETONIDE 40 MG/ML IJ SUSP
INTRAMUSCULAR | Status: AC
  Filled 2018-04-28: qty 5

## 2018-04-28 MED ORDER — EPHEDRINE SULFATE 50 MG/ML IJ SOLN
INTRAMUSCULAR | Status: DC | PRN
  Administered 2018-04-28: 10 mg via INTRAVENOUS

## 2018-04-28 MED ORDER — LACTATED RINGERS IV SOLN
INTRAVENOUS | Status: DC
  Administered 2018-04-28: 07:00:00 via INTRAVENOUS

## 2018-04-28 MED ORDER — FENTANYL CITRATE (PF) 250 MCG/5ML IJ SOLN
INTRAMUSCULAR | Status: DC | PRN
  Administered 2018-04-28: 50 ug via INTRAVENOUS

## 2018-04-28 MED ORDER — EPINEPHRINE PF 1 MG/ML IJ SOLN
INTRAMUSCULAR | Status: DC | PRN
  Administered 2018-04-28: 1 mg

## 2018-04-28 MED ORDER — PROPOFOL 10 MG/ML IV BOLUS
INTRAVENOUS | Status: DC | PRN
  Administered 2018-04-28: 40 mg via INTRAVENOUS

## 2018-04-28 MED ORDER — 0.9 % SODIUM CHLORIDE (POUR BTL) OPTIME
TOPICAL | Status: DC | PRN
  Administered 2018-04-28: 1000 mL

## 2018-04-28 MED ORDER — EPINEPHRINE HCL (NASAL) 0.1 % NA SOLN
NASAL | Status: AC
  Filled 2018-04-28: qty 30

## 2018-04-28 MED ORDER — PROPOFOL 500 MG/50ML IV EMUL
INTRAVENOUS | Status: DC | PRN
  Administered 2018-04-28: 150 ug/kg/min via INTRAVENOUS

## 2018-04-28 MED ORDER — PROPOFOL 10 MG/ML IV BOLUS
INTRAVENOUS | Status: AC
  Filled 2018-04-28: qty 20

## 2018-04-28 MED ORDER — OXYCODONE HCL 5 MG/5ML PO SOLN: 5.0000 mg | Freq: Once | ORAL | Status: DC | PRN

## 2018-04-28 MED ORDER — ONDANSETRON HCL 4 MG/2ML IJ SOLN
4.0000 mg | Freq: Once | INTRAMUSCULAR | Status: AC | PRN
  Administered 2018-04-28: 4 mg via INTRAVENOUS

## 2018-04-28 MED ORDER — FENTANYL CITRATE (PF) 100 MCG/2ML IJ SOLN: 25.0000 ug | INTRAMUSCULAR | Status: DC | PRN

## 2018-04-28 MED ORDER — OXYCODONE HCL 5 MG PO TABS: 5.0000 mg | ORAL_TABLET | Freq: Once | ORAL | Status: DC | PRN

## 2018-04-28 MED ORDER — DEXAMETHASONE SODIUM PHOSPHATE 10 MG/ML IJ SOLN
INTRAMUSCULAR | Status: DC | PRN
  Administered 2018-04-28: 10 mg via INTRAVENOUS

## 2018-04-28 MED ORDER — PHENYLEPHRINE HCL 10 MG/ML IJ SOLN
INTRAMUSCULAR | Status: DC | PRN
  Administered 2018-04-28: 80 ug via INTRAVENOUS

## 2018-04-28 MED ORDER — FENTANYL CITRATE (PF) 250 MCG/5ML IJ SOLN
INTRAMUSCULAR | Status: AC
  Filled 2018-04-28: qty 5

## 2018-04-28 MED ORDER — LIDOCAINE 2% (20 MG/ML) 5 ML SYRINGE
INTRAMUSCULAR | Status: DC | PRN
  Administered 2018-04-28: 100 mg via INTRAVENOUS

## 2018-04-28 SURGICAL SUPPLY — 26 items
CANISTER SUCT 3000ML PPV (MISCELLANEOUS) ×3 IMPLANT
CONT SPEC 4OZ CLIKSEAL STRL BL (MISCELLANEOUS) IMPLANT
COVER BACK TABLE 60X90IN (DRAPES) ×3 IMPLANT
COVER MAYO STAND STRL (DRAPES) ×3 IMPLANT
CRADLE DONUT ADULT HEAD (MISCELLANEOUS) IMPLANT
DRAPE HALF SHEET 40X57 (DRAPES) ×3 IMPLANT
GAUZE SPONGE 4X4 12PLY STRL (GAUZE/BANDAGES/DRESSINGS) ×3 IMPLANT
GLOVE BIO SURGEON STRL SZ7.5 (GLOVE) ×3 IMPLANT
GOWN STRL REUS W/ TWL LRG LVL3 (GOWN DISPOSABLE) IMPLANT
GOWN STRL REUS W/TWL LRG LVL3 (GOWN DISPOSABLE)
GUARD TEETH (MISCELLANEOUS) IMPLANT
KIT BASIN OR (CUSTOM PROCEDURE TRAY) ×3 IMPLANT
KIT PROLARN PLUS GEL W/NDL (Prosthesis and Implant ENT) ×3 IMPLANT
KIT TURNOVER KIT B (KITS) ×3 IMPLANT
NEEDLE HYPO 25GX1X1/2 BEV (NEEDLE) IMPLANT
NEEDLE TRANS ORAL INJECTION (NEEDLE) IMPLANT
NS IRRIG 1000ML POUR BTL (IV SOLUTION) ×3 IMPLANT
PAD ARMBOARD 7.5X6 YLW CONV (MISCELLANEOUS) ×6 IMPLANT
PATTIES SURGICAL .5 X1 (DISPOSABLE) IMPLANT
PATTIES SURGICAL .5 X3 (DISPOSABLE) IMPLANT
SOLUTION ANTI FOG 6CC (MISCELLANEOUS) ×3 IMPLANT
SURGILUBE 2OZ TUBE FLIPTOP (MISCELLANEOUS) IMPLANT
TOWEL OR 17X24 6PK STRL BLUE (TOWEL DISPOSABLE) ×6 IMPLANT
TUBE CONNECTING 12'X1/4 (SUCTIONS) ×1
TUBE CONNECTING 12X1/4 (SUCTIONS) ×2 IMPLANT
WATER STERILE IRR 1000ML POUR (IV SOLUTION) IMPLANT

## 2018-04-28 NOTE — Brief Op Note (Signed)
04/28/2018  9:32 AM  PATIENT:  Jordan Chaney  67 y.o. male  PRE-OPERATIVE DIAGNOSIS:  Vocal cord scarring  POST-OPERATIVE DIAGNOSIS:  Vocal cord scarring  PROCEDURE:  Procedure(s): MICROLARYNGOSCOPY WITH VOCAL CORD INJECTION with Jet Ventilation.  Prolaryn Injection (N/A)  SURGEON:  Surgeon(s) and Role:    * Christia ReadingBates, Mamta Rimmer, MD - Primary  PHYSICIAN ASSISTANT:   ASSISTANTS: none   ANESTHESIA:   general  EBL:  None   BLOOD ADMINISTERED:none  DRAINS: none   LOCAL MEDICATIONS USED:  NONE  SPECIMEN:  No Specimen  DISPOSITION OF SPECIMEN:  N/A  COUNTS:  YES  TOURNIQUET:  * No tourniquets in log *  DICTATION: .Other Dictation: Dictation Number [id number]  PLAN OF CARE: Discharge to home after PACU  PATIENT DISPOSITION:  PACU - hemodynamically stable.   Delay start of Pharmacological VTE agent (>24hrs) due to surgical blood loss or risk of bleeding: no

## 2018-04-28 NOTE — Transfer of Care (Signed)
Immediate Anesthesia Transfer of Care Note  Patient: Jordan FordyceRoger Dale Blann  Procedure(s) Performed: MICROLARYNGOSCOPY WITH VOCAL CORD INJECTION with Jet Ventilation.  Prolaryn Injection (N/A Throat)  Patient Location: PACU  Anesthesia Type:General  Level of Consciousness: awake, alert  and patient cooperative  Airway & Oxygen Therapy: Patient Spontanous Breathing and Patient connected to nasal cannula oxygen  Post-op Assessment: Report given to RN and Post -op Vital signs reviewed and stable  Post vital signs: Reviewed and stable  Last Vitals:  Vitals Value Taken Time  BP 146/85 04/28/2018  9:50 AM  Temp    Pulse 72 04/28/2018  9:52 AM  Resp 12 04/28/2018  9:52 AM  SpO2 93 % 04/28/2018  9:52 AM  Vitals shown include unvalidated device data.  Last Pain:  Vitals:   04/28/18 0707  TempSrc: Oral         Complications: No apparent anesthesia complications

## 2018-04-28 NOTE — Anesthesia Preprocedure Evaluation (Addendum)
Anesthesia Evaluation  Patient identified by MRN, date of birth, ID band Patient awake    Reviewed: Allergy & Precautions, NPO status , Patient's Chart, lab work & pertinent test results  History of Anesthesia Complications Negative for: history of anesthetic complications  Airway Mallampati: III  TM Distance: >3 FB Neck ROM: Full    Dental  (+) Dental Advisory Given, Loose,    Pulmonary former smoker,    breath sounds clear to auscultation       Cardiovascular Exercise Tolerance: Good hypertension, Pt. on medications (-) angina+ CAD, + CABG and + Peripheral Vascular Disease  + Valvular Problems/Murmurs AS  Rhythm:Regular Rate:Normal   Carotid duplex 03/31/18:  1. No hemodynamically significant stenosis in either the R or L carotid arterial systems (exam done s/p left CEA)  Nuclear stress test 10/11/17: 1. Resting EKG shows NSR. 2. Resting and stress EKG shows normal ST segment; no ventricular tachycardia, significant QRS prolongation or heart block 3. Both the rest and stress images are within normal limits. No significant reversible ischemia or fixed scar. 4. EF 62%. Normal LV segmental wall motion.  Echo 09/30/17: 1. Trace amount of aortic regurgitation. Moderate aortic stenosis.  2. Normal cardiac chamber sizes and function; no pericardial effusion or intracardiac mass.    Neuro/Psych  Headaches,  Hard of hearing  negative psych ROS   GI/Hepatic Neg liver ROS, hiatal hernia, GERD  Medicated and Controlled,  Endo/Other   Obesity   Renal/GU negative Renal ROS  negative genitourinary   Musculoskeletal  (+) Arthritis ,   Abdominal (+) + obese,   Peds  Hematology negative hematology ROS (+)   Anesthesia Other Findings   Reproductive/Obstetrics                           Anesthesia Physical Anesthesia Plan  ASA: III  Anesthesia Plan: General   Post-op Pain  Management:    Induction: Intravenous  PONV Risk Score and Plan: 2 and Treatment may vary due to age or medical condition and Ondansetron  Airway Management Planned: Natural Airway  Additional Equipment: None  Intra-op Plan:   Post-operative Plan:   Informed Consent: I have reviewed the patients History and Physical, chart, labs and discussed the procedure including the risks, benefits and alternatives for the proposed anesthesia with the patient or authorized representative who has indicated his/her understanding and acceptance.   Dental advisory given  Plan Discussed with: CRNA and Anesthesiologist  Anesthesia Plan Comments:        Anesthesia Quick Evaluation

## 2018-04-28 NOTE — Anesthesia Postprocedure Evaluation (Signed)
Anesthesia Post Note  Patient: Jordan Chaney  Procedure(s) Performed: MICROLARYNGOSCOPY WITH VOCAL CORD INJECTION with Jet Ventilation.  Prolaryn Injection (N/A Throat)     Patient location during evaluation: PACU Anesthesia Type: General Level of consciousness: awake and alert Pain management: pain level controlled Vital Signs Assessment: post-procedure vital signs reviewed and stable Respiratory status: spontaneous breathing, nonlabored ventilation and respiratory function stable Cardiovascular status: blood pressure returned to baseline and stable Postop Assessment: no apparent nausea or vomiting Anesthetic complications: no    Last Vitals:  Vitals:   04/28/18 0707 04/28/18 0950  BP: (!) 144/84   Pulse: 63   Resp: 20   Temp: 36.6 C 36.5 C  SpO2: 98%     Last Pain:  Vitals:   04/28/18 0950  TempSrc:   PainSc: 0-No pain                 Beryle Lathehomas E Lorelai Huyser
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
## 2018-05-01 ENCOUNTER — Encounter (HOSPITAL_COMMUNITY): Payer: Self-pay | Admitting: Otolaryngology

## 2018-09-12 ENCOUNTER — Other Ambulatory Visit: Payer: Self-pay | Admitting: Orthopedic Surgery

## 2018-09-25 ENCOUNTER — Encounter (HOSPITAL_COMMUNITY): Payer: Self-pay

## 2018-09-25 NOTE — Patient Instructions (Addendum)
Your procedure is scheduled on: Monday, Jan. 20, 2020   Surgery Time:  12:48PM-2:41PM   Report to [facility] Main  Entrance    Report to admitting at AM   Call this number if you have problems the morning of surgery [phone]   Do not eat food or drink liquids :After Midnight.   Brush your teeth the morning of surgery.   Do NOT smoke after Midnight   Take these medicines the morning of surgery with A SIP OF WATER: Allopurinol, Metoprolol, Omeprazole                               You may not have any metal on your body including jewelry, and body piercings             Do not wear lotions, powders, perfumes/cologne, or deodorant                         Men may shave face and neck.   Do not bring valuables to the hospital. [name] IS NOT             RESPONSIBLE   FOR VALUABLES.   Contacts, dentures or bridgework may not be worn into surgery.   Leave suitcase in the car. After surgery it may be brought to your room.    Special Instructions: Bring a copy of your healthcare power of attorney and living will documents         the day of surgery if you haven't scanned them in before.              Please read over the following fact sheets you were given:  [facility] - Preparing for Surgery Before surgery, you can play an important role.  Because skin is not sterile, your skin needs to be as free of germs as possible.  You can reduce the number of germs on your skin by washing with CHG (chlorahexidine gluconate) soap before surgery.  CHG is an antiseptic cleaner which kills germs and bonds with the skin to continue killing germs even after washing. Please DO NOT use if you have an allergy to CHG or antibacterial soaps.  If your skin becomes reddened/irritated stop using the CHG and inform your nurse when you arrive at Short Stay. Do not shave (including legs and underarms) for at least 48 hours prior to the first CHG shower.  You may shave your face/neck.  Please  follow these instructions carefully:  1.  Shower with CHG Soap the night before surgery and the  morning of surgery.  2.  If you choose to wash your hair, wash your hair first as usual with your normal  shampoo.  3.  After you shampoo, rinse your hair and body thoroughly to remove the shampoo.                             4.  Use CHG as you would any other liquid soap.  You can apply chg directly to the skin and wash.  Gently with a scrungie or clean washcloth.  5.  Apply the CHG Soap to your body ONLY FROM THE NECK DOWN.   Do   not use on face/ open  Wound or open sores. Avoid contact with eyes, ears mouth and   genitals (private parts).                       Wash face,  Genitals (private parts) with your normal soap.             6.  Wash thoroughly, paying special attention to the area where your    surgery  will be performed.  7.  Thoroughly rinse your body with warm water from the neck down.  8.  DO NOT shower/wash with your normal soap after using and rinsing off the CHG Soap.                9.  Pat yourself dry with a clean towel.            10.  Wear clean pajamas.            11.  Place clean sheets on your bed the night of your first shower and do not  sleep with pets. Day of Surgery : Do not apply any lotions/deodorants the morning of surgery.  Please wear clean clothes to the hospital/surgery center.  FAILURE TO FOLLOW THESE INSTRUCTIONS MAY RESULT IN THE CANCELLATION OF YOUR SURGERY  PATIENT SIGNATURE_________________________________  NURSE SIGNATURE__________________________________  ________________________________________________________________________   Jordan Chaney  An incentive spirometer is a tool that can help keep your lungs clear and active. This tool measures how well you are filling your lungs with each breath. Taking long deep breaths may help reverse or decrease the chance of developing breathing (pulmonary) problems (especially  infection) following:  A long period of time when you are unable to move or be active. BEFORE THE PROCEDURE   If the spirometer includes an indicator to show your best effort, your nurse or respiratory therapist will set it to a desired goal.  If possible, sit up straight or lean slightly forward. Try not to slouch.  Hold the incentive spirometer in an upright position. INSTRUCTIONS FOR USE  1. Sit on the edge of your bed if possible, or sit up as far as you can in bed or on a chair. 2. Hold the incentive spirometer in an upright position. 3. Breathe out normally. 4. Place the mouthpiece in your mouth and seal your lips tightly around it. 5. Breathe in slowly and as deeply as possible, raising the piston or the ball toward the top of the column. 6. Hold your breath for 3-5 seconds or for as long as possible. Allow the piston or ball to fall to the bottom of the column. 7. Remove the mouthpiece from your mouth and breathe out normally. 8. Rest for a few seconds and repeat Steps 1 through 7 at least 10 times every 1-2 hours when you are awake. Take your time and take a few normal breaths between deep breaths. 9. The spirometer may include an indicator to show your best effort. Use the indicator as a goal to work toward during each repetition. 10. After each set of 10 deep breaths, practice coughing to be sure your lungs are clear. If you have an incision (the cut made at the time of surgery), support your incision when coughing by placing a pillow or rolled up towels firmly against it. Once you are able to get out of bed, walk around indoors and cough well. You may stop using the incentive spirometer when instructed by your caregiver.  RISKS AND COMPLICATIONS  Take your time  so you do not get dizzy or light-headed.  If you are in pain, you may need to take or ask for pain medication before doing incentive spirometry. It is harder to take a deep breath if you are having pain. AFTER  USE  Rest and breathe slowly and easily.  It can be helpful to keep track of a log of your progress. Your caregiver can provide you with a simple table to help with this. If you are using the spirometer at home, follow these instructions: [place] IF:   You are having difficultly using the spirometer.  You have trouble using the spirometer as often as instructed.  Your pain medication is not giving enough relief while using the spirometer.  You develop fever of 100.5 F (38.1 C) or higher. SEEK IMMEDIATE MEDICAL CARE IF:   You cough up bloody sputum that had not been present before.  You develop fever of 102 F (38.9 C) or greater.  You develop worsening pain at or near the incision site. MAKE SURE YOU:   Understand these instructions.  Will watch your condition.  Will get help right away if you are not doing well or get worse. Document Released: 01/10/2007 Document Revised: 11/22/2011 Document Reviewed: 03/13/2007 ExitCare Patient Information 2014 ExitCare, [place].   ________________________________________________________________________  WHAT IS A BLOOD TRANSFUSION? Blood Transfusion Information  A transfusion is the replacement of blood or some of its parts. Blood is made up of multiple cells which provide different functions.  Red blood cells carry oxygen and are used for blood loss replacement.  White blood cells fight against infection.  Platelets control bleeding.  Plasma helps clot blood.  Other blood products are available for specialized needs, such as hemophilia or other clotting disorders. BEFORE THE TRANSFUSION  Who gives blood for transfusions?   Healthy volunteers who are fully evaluated to make sure their blood is safe. This is blood bank blood. Transfusion therapy is the safest it has ever been in the practice of medicine. Before blood is taken from a donor, a complete history is taken to make sure that person has no history of diseases  nor engages in risky social behavior (examples are intravenous drug use or sexual activity with multiple partners). The donor's travel history is screened to minimize risk of transmitting infections, such as malaria. The donated blood is tested for signs of infectious diseases, such as HIV and hepatitis. The blood is then tested to be sure it is compatible with you in order to minimize the chance of a transfusion reaction. If you or a relative donates blood, this is often done in anticipation of surgery and is not appropriate for emergency situations. It takes many days to process the donated blood. RISKS AND COMPLICATIONS Although transfusion therapy is very safe and saves many lives, the main dangers of transfusion include:   Getting an infectious disease.  Developing a transfusion reaction. This is an allergic reaction to something in the blood you were given. Every precaution is taken to prevent this. The decision to have a blood transfusion has been considered carefully by your caregiver before blood is given. Blood is not given unless the benefits outweigh the risks. AFTER THE TRANSFUSION  Right after receiving a blood transfusion, you will usually feel much better and more energetic. This is especially true if your red blood cells have gotten low (anemic). The transfusion raises the level of the red blood cells which carry oxygen, and this usually causes an energy increase.  The  nurse administering the transfusion will monitor you carefully for complications. HOME CARE INSTRUCTIONS  No special instructions are needed after a transfusion. You may find your energy is better. Speak with your caregiver about any limitations on activity for underlying diseases you may have. SEEK MEDICAL CARE IF:   Your condition is not improving after your transfusion.  You develop redness or irritation at the intravenous (IV) site. SEEK IMMEDIATE MEDICAL CARE IF:  Any of the following symptoms occur over the  next 12 hours:  Shaking chills.  You have a temperature by mouth above 102 F (38.9 C), not controlled by medicine.  Chest, back, or muscle pain.  People around you feel you are not acting correctly or are confused.  Shortness of breath or difficulty breathing.  Dizziness and fainting.  You get a rash or develop hives.  You have a decrease in urine output.  Your urine turns a dark color or changes to pink, red, or brown. Any of the following symptoms occur over the next 10 days:  You have a temperature by mouth above 102 F (38.9 C), not controlled by medicine.  Shortness of breath.  Weakness after normal activity.  The white part of the eye turns yellow (jaundice).  You have a decrease in the amount of urine or are urinating less often.  Your urine turns a dark color or changes to pink, red, or brown. Document Released: 08/27/2000 Document Revised: 11/22/2011 Document Reviewed: 04/15/2008 [facility] Patient Information 2014 [place], [place].  _______________________________________________________________________

## 2018-09-25 NOTE — Pre-Procedure Instructions (Signed)
The following are in epic: EKG 11/08/2017 Vascular US carotid 03/31/2018 Stress 10/11/2017

## 2018-09-26 ENCOUNTER — Encounter (HOSPITAL_COMMUNITY)
Admission: RE | Admit: 2018-09-26 | Discharge: 2018-09-26 | Disposition: A | Discharge status: Home / Self Care | Payer: Medicare Other | Source: Ambulatory Visit | Attending: Orthopedic Surgery | Admitting: Orthopedic Surgery

## 2018-09-26 ENCOUNTER — Other Ambulatory Visit: Payer: Self-pay

## 2018-09-26 ENCOUNTER — Ambulatory Visit (HOSPITAL_COMMUNITY)
Admission: RE | Admit: 2018-09-26 | Discharge: 2018-09-26 | Disposition: A | Discharge status: Home / Self Care | Payer: Medicare Other | Source: Ambulatory Visit | Attending: Orthopedic Surgery | Admitting: Orthopedic Surgery

## 2018-09-26 ENCOUNTER — Encounter (HOSPITAL_COMMUNITY): Payer: Self-pay

## 2018-09-26 DIAGNOSIS — Z01818 Encounter for other preprocedural examination: Secondary | ICD-10-CM | POA: Insufficient documentation

## 2018-09-26 HISTORY — DX: Prediabetes: R73.03

## 2018-09-26 HISTORY — DX: Dysphonia: R49.0

## 2018-09-26 LAB — URINALYSIS, ROUTINE W REFLEX MICROSCOPIC
Bilirubin Urine: NEGATIVE
HGB URINE DIPSTICK: NEGATIVE
Ketones, ur: NEGATIVE mg/dL
LEUKOCYTES UA: NEGATIVE
Nitrite: NEGATIVE
Protein, ur: NEGATIVE mg/dL
Specific Gravity, Urine: 1.016 (ref 1.005–1.030)
pH: 5 (ref 5.0–8.0)

## 2018-09-26 LAB — CBC WITH DIFFERENTIAL/PLATELET
Abs Immature Granulocytes: 0.04 10*3/uL (ref 0.00–0.07)
Basophils Absolute: 0.1 10*3/uL (ref 0.0–0.1)
Basophils Relative: 1 %
EOS PCT: 2 %
Eosinophils Absolute: 0.2 10*3/uL (ref 0.0–0.5)
HCT: 47.2 % (ref 39.0–52.0)
Hemoglobin: 15.7 g/dL (ref 13.0–17.0)
Immature Granulocytes: 1 %
Lymphocytes Relative: 25 %
Lymphs Abs: 1.9 10*3/uL (ref 0.7–4.0)
MCH: 31.7 pg (ref 26.0–34.0)
MCHC: 33.3 g/dL (ref 30.0–36.0)
MCV: 95.4 fL (ref 80.0–100.0)
Monocytes Absolute: 0.7 10*3/uL (ref 0.1–1.0)
Monocytes Relative: 9 %
NRBC: 0 % (ref 0.0–0.2)
Neutro Abs: 4.8 10*3/uL (ref 1.7–7.7)
Neutrophils Relative %: 62 %
Platelets: 211 10*3/uL (ref 150–400)
RBC: 4.95 MIL/uL (ref 4.22–5.81)
RDW: 12.3 % (ref 11.5–15.5)
WBC: 7.6 10*3/uL (ref 4.0–10.5)

## 2018-09-26 LAB — PROTIME-INR
INR: 0.9
Prothrombin Time: 12.1 seconds (ref 11.4–15.2)

## 2018-09-26 LAB — BASIC METABOLIC PANEL
Anion gap: 10 (ref 5–15)
BUN: 17 mg/dL (ref 8–23)
CO2: 25 mmol/L (ref 22–32)
CREATININE: 0.83 mg/dL (ref 0.61–1.24)
Calcium: 9.2 mg/dL (ref 8.9–10.3)
Chloride: 101 mmol/L (ref 98–111)
GFR calc Af Amer: >60 mL/min (ref >60)
GFR calc non Af Amer: >60 mL/min (ref >60)
Glucose, Bld: 214 mg/dL — ABNORMAL HIGH (ref 70–99)
Potassium: 4 mmol/L (ref 3.5–5.1)
Sodium: 136 mmol/L (ref 135–145)

## 2018-09-26 LAB — APTT: aPTT: 30 seconds (ref 24–36)

## 2018-09-26 LAB — SURGICAL PCR SCREEN
MRSA, PCR: NEGATIVE
Staphylococcus aureus: NEGATIVE

## 2018-09-26 LAB — HEMOGLOBIN A1C
Hgb A1c MFr Bld: 8.4 % — ABNORMAL HIGH (ref 4.8–5.6)
Mean Plasma Glucose: 194.38 mg/dL

## 2018-09-26 LAB — ABO/RH: ABO/RH(D): A POS

## 2018-09-26 NOTE — Pre-Procedure Instructions (Signed)
BMP, UA, Hgb A1c results 09/26/2018 sent to Dr. Turner Daniels via epic.  Chart sent to [facility]. 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

## 2018-09-27 ENCOUNTER — Encounter (HOSPITAL_COMMUNITY): Payer: Self-pay | Admitting: Physician Assistant

## 2018-10-02 ENCOUNTER — Encounter (HOSPITAL_COMMUNITY): Admission: RE | Payer: Self-pay | Source: Home / Self Care

## 2018-10-02 ENCOUNTER — Inpatient Hospital Stay (HOSPITAL_COMMUNITY): Admission: RE | Admit: 2018-10-02 | Payer: Medicare Other | Source: Home / Self Care | Admitting: Orthopedic Surgery

## 2018-10-02 LAB — TYPE AND SCREEN
ABO/RH(D): A POS
Antibody Screen: NEGATIVE

## 2018-10-02 SURGERY — ARTHROPLASTY, KNEE, TOTAL
Anesthesia: Spinal | Laterality: Right

## 2019-02-20 ENCOUNTER — Other Ambulatory Visit: Payer: Self-pay | Admitting: Orthopedic Surgery

## 2019-03-14 ENCOUNTER — Other Ambulatory Visit: Payer: Self-pay | Admitting: Orthopedic Surgery
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
## 2019-03-20 ENCOUNTER — Other Ambulatory Visit: Payer: Self-pay | Admitting: Orthopedic Surgery

## 2019-03-20 NOTE — Progress Notes (Signed)
CHEST XRAY 09-26-18 EPIC

## 2019-03-20 NOTE — Patient Instructions (Addendum)
YOU NEED TO HAVE A COVID 19 TEST ON_Thursday 7/9______ @_______ , THIS TEST MUST BE DONE BEFORE SURGERY, [facility]. ONCE YOUR COVID TEST IS COMPLETED, PLEASE BEGIN THE QUARANTINE INSTRUCTIONS AS OUTLINED IN YOUR HANDOUT.                Jordan Chaney    Your procedure is scheduled on: 03-26-2019   [facility]  Report to SHORT STAY at 5:30 AM      Call this number if you have problems the morning of surgery [phone]     Remember: BRUSH YOUR TEETH MORNING OF SURGERY AND RINSE YOUR MOUTH OUT, NO CHEWING GUM CANDY OR MINTS.   NO SOLID FOOD AFTER MIDNIGHT THE NIGHT PRIOR TO SURGERY. NOTHING BY MOUTH EXCEPT CLEAR LIQUIDS UNTIL 430 AM. PLEASE FINISH G2 DRINK PER SURGEON ORDER 3 HOURS PRIOR TO SCHEDULED SURGERY TIME WHICH NEEDS TO BE COMPLETED AT 430 AM.  CLEAR LIQUID DIET   Foods Allowed                                                                     Foods Excluded  Coffee and tea, regular and decaf                             liquids that you cannot  Plain Jell-O in any flavor                                             see through such as: Fruit ices (not with fruit pulp)                                     milk, soups, orange juice  Iced Popsicles                                    All solid food Carbonated beverages, regular and diet                                    Cranberry, grape and apple juices Sports drinks like Gatorade Lightly seasoned clear broth or consume(fat free) Sugar, honey syrup  Sample Menu Breakfast                                Lunch                                     Supper Cranberry juice                    Beef broth  Chicken broth Jell-O                                     Grape juice                           Apple juice Coffee or tea                        Jell-O                                      Popsicle                  Coffee or tea                        Coffee or tea  _____________________________________________________________________     Take these medicines the morning of surgery with A SIP OF WATER:   Toprol XL, Allopurinol, Prilosec  DO NOT TAKE ANY DIABETIC MEDICATIONS DAY OF YOUR SURGERY      How to Manage Your Diabetes Before and After Surgery  Why is it important to control my blood sugar before and after surgery? . Improving blood sugar levels before and after surgery helps healing and can limit problems. . A way of improving blood sugar control is eating a healthy diet by: o  Eating less sugar and carbohydrates o  Increasing activity/exercise o  Talking with your doctor about reaching your blood sugar goals . High blood sugars (greater than 180 mg/dL) can raise your risk of infections and slow your recovery, so you will need to focus on controlling your diabetes during the weeks before surgery. . Make sure that the doctor who takes care of your diabetes knows about your planned surgery including the date and location.  How do I manage my blood sugar before surgery? . Check your blood sugar at least 4 times a day, starting 2 days before surgery, to make sure that the level is not too high or low. o Check your blood sugar the morning of your surgery when you wake up and every 2 hours until you get to the Short Stay unit. . If your blood sugar is less than 70 mg/dL, you will need to treat for low blood sugar: o Do not take insulin. o Treat a low blood sugar (less than 70 mg/dL) with  cup of clear juice (cranberry or apple), 4 glucose tablets, OR glucose gel. o Recheck blood sugar in 15 minutes after treatment (to make sure it is greater than 70 mg/dL). If your blood sugar is not greater than 70 mg/dL on recheck, call [phone] for further instructions. . Report your blood sugar to the short stay nurse when you get to Short Stay.  . If you are admitted to the hospital  after surgery: o Your blood sugar will be checked by the staff and you will probably be given insulin after surgery (instead of oral diabetes medicines) to make sure you have good blood sugar levels. o The goal for blood sugar control after surgery is 80-180 mg/dL.   WHAT DO I DO ABOUT MY DIABETES MEDICATION?  Marland Kitchen Do not take oral diabetes medicines (pills) the morning of surgery.  . THE DAY BEFORE SURGERY  TAKE GLIPIZIDE AND METFORMIN AS USUAL.       . THE MORNING OF SURGERY DO NOT TAKE GLIPIZIDE OR METFORMIN.                          You may not have any metal on your body including piercings               Do not wear jewelry, , lotions, powders or  deodorant                       Men may shave face and neck.   Do not bring valuables to the hospital. [facility] IS NOT             RESPONSIBLE   FOR VALUABLES.  Contacts, dentures or bridgework may not be worn into surgery.      _____________________________________________________________________             [facility] - Preparing for Surgery Before surgery, you can play an important role.   Because skin is not sterile, your skin needs to be as free of germs as possible.   You can reduce the number of germs on your skin by washing with CHG (chlorahexidine gluconate) soap before surgery.   CHG is an antiseptic cleaner which kills germs and bonds with the skin to continue killing germs even after washing. Please DO NOT use if you have an allergy to CHG or antibacterial soaps.   If your skin becomes reddened/irritated stop using the CHG and inform your nurse when you arrive at Short Stay.  You may shave your face/neck. Please follow these instructions carefully:  1.  Shower with CHG Soap the night before surgery and the  morning of Surgery.  2.  If you choose to wash your hair, wash your hair first as usual with your  normal  shampoo.  3.  After you shampoo, rinse your hair and body thoroughly to remove the  shampoo.                                         4.  Use CHG as you would any other liquid soap.  You can apply chg directly  to the skin and wash                       Gently with a scrungie or clean washcloth.  5.  Apply the CHG Soap to your body ONLY FROM THE NECK DOWN.   Do not use on face/ open                           Wound or open sores. Avoid contact with eyes, ears mouth and genitals (private parts).                       Wash face,  Genitals (private parts) with your normal soap.             6.  Wash thoroughly, paying special attention to the area where your surgery  will be performed.  7.  Thoroughly rinse your body with warm water from the neck down.  8.  DO NOT shower/wash with your normal soap after using and rinsing off  the CHG Soap.  9.  Pat yourself dry with a clean towel.            10.  Wear clean pajamas.            11.  Place clean sheets on your bed the night of your first shower and do not  sleep with pets. Day of Surgery : Do not apply any lotions/deodorants the morning of surgery.  Please wear clean clothes to the hospital/surgery center.  FAILURE TO FOLLOW THESE INSTRUCTIONS MAY RESULT IN THE CANCELLATION OF YOUR SURGERY PATIENT SIGNATURE_________________________________  NURSE SIGNATURE__________________________________  ________________________________________________________________________   Rogelia MireIncentive Spirometer  An incentive spirometer is a tool that can help keep your lungs clear and active. This tool measures how well you are filling your lungs with each breath. Taking long deep breaths may help reverse or decrease the chance of developing breathing (pulmonary) problems (especially infection) following:  A long period of time when you are unable to move or be active. BEFORE THE PROCEDURE   If the spirometer includes an indicator to show your best effort, your nurse or respiratory therapist will set it to a desired goal.  If possible, sit up straight or lean  slightly forward. Try not to slouch.  Hold the incentive spirometer in an upright position. INSTRUCTIONS FOR USE  1. Sit on the edge of your bed if possible, or sit up as far as you can in bed or on a chair. 2. Hold the incentive spirometer in an upright position. 3. Breathe out normally. 4. Place the mouthpiece in your mouth and seal your lips tightly around it. 5. Breathe in slowly and as deeply as possible, raising the piston or the ball toward the top of the column. 6. Hold your breath for 3-5 seconds or for as long as possible. Allow the piston or ball to fall to the bottom of the column. 7. Remove the mouthpiece from your mouth and breathe out normally. 8. Rest for a few seconds and repeat Steps 1 through 7 at least 10 times every 1-2 hours when you are awake. Take your time and take a few normal breaths between deep breaths. 9. The spirometer may include an indicator to show your best effort. Use the indicator as a goal to work toward during each repetition. 10. After each set of 10 deep breaths, practice coughing to be sure your lungs are clear. If you have an incision (the cut made at the time of surgery), support your incision when coughing by placing a pillow or rolled up towels firmly against it. Once you are able to get out of bed, walk around indoors and cough well. You may stop using the incentive spirometer when instructed by your caregiver.  RISKS AND COMPLICATIONS  Take your time so you do not get dizzy or light-headed.  If you are in pain, you may need to take or ask for pain medication before doing incentive spirometry. It is harder to take a deep breath if you are having pain. AFTER USE  Rest and breathe slowly and easily.  It can be helpful to keep track of a log of your progress. Your caregiver can provide you with a simple table to help with this. If you are using the spirometer at home, follow these instructions: SEEK MEDICAL CARE IF:   You are having difficultly  using the spirometer.  You have trouble using the spirometer as often as instructed.  Your pain medication is not giving enough relief while using the spirometer.  You  develop fever of 100.5 F (38.1 C) or higher. SEEK IMMEDIATE MEDICAL CARE IF:   You cough up bloody sputum that had not been present before.  You develop fever of 102 F (38.9 C) or greater.  You develop worsening pain at or near the incision site. MAKE SURE YOU:   Understand these instructions.  Will watch your condition.  Will get help right away if you are not doing well or get worse. Document Released: 01/10/2007 Document Revised: 11/22/2011 Document Reviewed: 03/13/2007 ExitCare Patient Information 2014 [employer], [place].   ________________________________________________________________________  WHAT IS A BLOOD TRANSFUSION? Blood Transfusion Information  A transfusion is the replacement of blood or some of its parts. Blood is made up of multiple cells which provide different functions.  Red blood cells carry oxygen and are used for blood loss replacement.  White blood cells fight against infection.  Platelets control bleeding.  Plasma helps clot blood.  Other blood products are available for specialized needs, such as hemophilia or other clotting disorders. BEFORE THE TRANSFUSION  Who gives blood for transfusions?   Healthy volunteers who are fully evaluated to make sure their blood is safe. This is blood bank blood. Transfusion therapy is the safest it has ever been in the practice of medicine. Before blood is taken from a donor, a complete history is taken to make sure that person has no history of diseases nor engages in risky social behavior (examples are intravenous drug use or sexual activity with multiple partners). The donor's travel history is screened to minimize risk of transmitting infections, such as malaria. The donated blood is tested for signs of infectious diseases, such as HIV and  hepatitis. The blood is then tested to be sure it is compatible with you in order to minimize the chance of a transfusion reaction. If you or a relative donates blood, this is often done in anticipation of surgery and is not appropriate for emergency situations. It takes many days to process the donated blood. RISKS AND COMPLICATIONS Although transfusion therapy is very safe and saves many lives, the main dangers of transfusion include:   Getting an infectious disease.  Developing a transfusion reaction. This is an allergic reaction to something in the blood you were given. Every precaution is taken to prevent this. The decision to have a blood transfusion has been considered carefully by your caregiver before blood is given. Blood is not given unless the benefits outweigh the risks. AFTER THE TRANSFUSION  Right after receiving a blood transfusion, you will usually feel much better and more energetic. This is especially true if your red blood cells have gotten low (anemic). The transfusion raises the level of the red blood cells which carry oxygen, and this usually causes an energy increase.  The nurse administering the transfusion will monitor you carefully for complications. HOME CARE INSTRUCTIONS  No special instructions are needed after a transfusion. You may find your energy is better. Speak with your caregiver about any limitations on activity for underlying diseases you may have. SEEK MEDICAL CARE IF:   Your condition is not improving after your transfusion.  You develop redness or irritation at the intravenous (IV) site. SEEK IMMEDIATE MEDICAL CARE IF:  Any of the following symptoms occur over the next 12 hours:  Shaking chills.  You have a temperature by mouth above 102 F (38.9 C), not controlled by medicine.  Chest, back, or muscle pain.  People around you feel you are not acting correctly or are confused.  Shortness of breath  or difficulty breathing.  Dizziness and  fainting.  You get a rash or develop hives.  You have a decrease in urine output.  Your urine turns a dark color or changes to pink, red, or brown. Any of the following symptoms occur over the next 10 days:  You have a temperature by mouth above 102 F (38.9 C), not controlled by medicine.  Shortness of breath.  Weakness after normal activity.  The white part of the eye turns yellow (jaundice).  You have a decrease in the amount of urine or are urinating less often.  Your urine turns a dark color or changes to pink, red, or brown. Document Released: 08/27/2000 Document Revised: 11/22/2011 Document Reviewed: 04/15/2008 [facility] Patient Information 2014 [place], [place].  _______________________________________________________________________

## 2019-03-21 ENCOUNTER — Other Ambulatory Visit: Payer: Self-pay

## 2019-03-21 ENCOUNTER — Encounter (HOSPITAL_COMMUNITY)
Admission: RE | Admit: 2019-03-21 | Discharge: 2019-03-21 | Disposition: A | Discharge status: Home / Self Care | Payer: Medicare Other | Source: Ambulatory Visit | Attending: Orthopedic Surgery | Admitting: Orthopedic Surgery

## 2019-03-21 ENCOUNTER — Encounter (HOSPITAL_COMMUNITY): Payer: Self-pay

## 2019-03-21 DIAGNOSIS — Z01818 Encounter for other preprocedural examination: Secondary | ICD-10-CM | POA: Insufficient documentation

## 2019-03-21 DIAGNOSIS — Z1159 Encounter for screening for other viral diseases: Secondary | ICD-10-CM | POA: Insufficient documentation

## 2019-03-21 DIAGNOSIS — I498 Other specified cardiac arrhythmias: Secondary | ICD-10-CM | POA: Diagnosis not present

## 2019-03-21 DIAGNOSIS — M1712 Unilateral primary osteoarthritis, left knee: Secondary | ICD-10-CM | POA: Insufficient documentation

## 2019-03-21 HISTORY — DX: Type 2 diabetes mellitus without complications: E11.9

## 2019-03-21 HISTORY — DX: Other complications of anesthesia, initial encounter: T88.59XA

## 2019-03-21 LAB — URINALYSIS, ROUTINE W REFLEX MICROSCOPIC
Bilirubin Urine: NEGATIVE
Glucose, UA: NEGATIVE mg/dL
Hgb urine dipstick: NEGATIVE
Ketones, ur: NEGATIVE mg/dL
Leukocytes,Ua: NEGATIVE
Nitrite: NEGATIVE
Protein, ur: NEGATIVE mg/dL
Specific Gravity, Urine: 1.014 (ref 1.005–1.030)
pH: 6 (ref 5.0–8.0)

## 2019-03-21 LAB — BASIC METABOLIC PANEL
Anion gap: 10 (ref 5–15)
BUN: 24 mg/dL — ABNORMAL HIGH (ref 8–23)
CO2: 26 mmol/L (ref 22–32)
Calcium: 10 mg/dL (ref 8.9–10.3)
Chloride: 101 mmol/L (ref 98–111)
Creatinine, Ser: 0.92 mg/dL (ref 0.61–1.24)
GFR calc Af Amer: >60 mL/min (ref >60)
GFR calc non Af Amer: >60 mL/min (ref >60)
Glucose, Bld: 112 mg/dL — ABNORMAL HIGH (ref 70–99)
Potassium: 4.6 mmol/L (ref 3.5–5.1)
Sodium: 137 mmol/L (ref 135–145)

## 2019-03-21 LAB — SURGICAL PCR SCREEN
MRSA, PCR: NEGATIVE
Staphylococcus aureus: NEGATIVE

## 2019-03-21 LAB — PROTIME-INR
INR: 1 (ref 0.8–1.2)
Prothrombin Time: 13.1 seconds (ref 11.4–15.2)

## 2019-03-21 LAB — APTT: aPTT: 30 seconds (ref 24–36)

## 2019-03-21 LAB — CBC WITH DIFFERENTIAL/PLATELET
Abs Immature Granulocytes: 0.04 10*3/uL (ref 0.00–0.07)
Basophils Absolute: 0.1 10*3/uL (ref 0.0–0.1)
Basophils Relative: 1 %
Eosinophils Absolute: 0.2 10*3/uL (ref 0.0–0.5)
Eosinophils Relative: 3 %
HCT: 47.6 % (ref 39.0–52.0)
Hemoglobin: 15.8 g/dL (ref 13.0–17.0)
Immature Granulocytes: 1 %
Lymphocytes Relative: 28 %
Lymphs Abs: 2.1 10*3/uL (ref 0.7–4.0)
MCH: 32 pg (ref 26.0–34.0)
MCHC: 33.2 g/dL (ref 30.0–36.0)
MCV: 96.6 fL (ref 80.0–100.0)
Monocytes Absolute: 0.7 10*3/uL (ref 0.1–1.0)
Monocytes Relative: 9 %
Neutro Abs: 4.6 10*3/uL (ref 1.7–7.7)
Neutrophils Relative %: 58 %
Platelets: 216 10*3/uL (ref 150–400)
RBC: 4.93 MIL/uL (ref 4.22–5.81)
RDW: 12.4 % (ref 11.5–15.5)
WBC: 7.7 10*3/uL (ref 4.0–10.5)
nRBC: 0 % (ref 0.0–0.2)

## 2019-03-21 LAB — GLUCOSE, CAPILLARY: Glucose-Capillary: 104 mg/dL — ABNORMAL HIGH (ref 70–99)

## 2019-03-21 NOTE — Progress Notes (Addendum)
Konrad Felix PA   Patient was told by Dr. Mayer Camel to stop ASA 03/22/19.  His PCP is Dr. Payton Doughty from [facility] [place] EKG and labs in epic. BUN was slightly elevated. 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 He said that it has been checked out and is fine but is afraid to be intubated.

## 2019-03-22 ENCOUNTER — Encounter (HOSPITAL_COMMUNITY): Payer: Self-pay | Admitting: Certified Registered"

## 2019-03-22 ENCOUNTER — Other Ambulatory Visit (HOSPITAL_COMMUNITY)
Admission: RE | Admit: 2019-03-22 | Discharge: 2019-03-22 | Disposition: A | Discharge status: Home / Self Care | Payer: Medicare Other | Source: Ambulatory Visit | Attending: Orthopedic Surgery | Admitting: Orthopedic Surgery

## 2019-03-22 ENCOUNTER — Encounter (HOSPITAL_COMMUNITY): Payer: Self-pay | Admitting: Physician Assistant

## 2019-03-22 DIAGNOSIS — Z01818 Encounter for other preprocedural examination: Secondary | ICD-10-CM | POA: Diagnosis not present

## 2019-03-22 LAB — HEMOGLOBIN A1C
Hgb A1c MFr Bld: 6.1 % — ABNORMAL HIGH (ref 4.8–5.6)
Mean Plasma Glucose: 128 mg/dL
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
## 2019-03-23 LAB — SARS CORONAVIRUS 2 (TAT 6-24 HRS): SARS Coronavirus 2: NEGATIVE
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
## 2019-03-25 MED ORDER — TRANEXAMIC ACID 1000 MG/10ML IV SOLN
2000.0000 mg | INTRAVENOUS | Status: DC
  Filled 2019-03-25: qty 20

## 2019-03-25 MED ORDER — BUPIVACAINE LIPOSOME 1.3 % IJ SUSP
20.0000 mL | INTRAMUSCULAR | Status: DC
  Filled 2019-03-25: qty 20

## 2019-03-26 ENCOUNTER — Encounter (HOSPITAL_COMMUNITY)
Admission: RE | Disposition: A | Discharge status: Home / Self Care | Payer: Self-pay | Source: Home / Self Care | Attending: Orthopedic Surgery

## 2019-03-26 ENCOUNTER — Encounter (HOSPITAL_COMMUNITY): Payer: Self-pay | Admitting: *Deleted

## 2019-03-26 ENCOUNTER — Ambulatory Visit (HOSPITAL_COMMUNITY)
Admission: RE | Admit: 2019-03-26 | Discharge: 2019-03-26 | Disposition: A | Discharge status: Home / Self Care | Payer: Medicare Other | Attending: Orthopedic Surgery | Admitting: Orthopedic Surgery

## 2019-03-26 DIAGNOSIS — Z538 Procedure and treatment not carried out for other reasons: Secondary | ICD-10-CM | POA: Insufficient documentation

## 2019-03-26 DIAGNOSIS — M1711 Unilateral primary osteoarthritis, right knee: Secondary | ICD-10-CM | POA: Diagnosis not present

## 2019-03-26 LAB — TYPE AND SCREEN
ABO/RH(D): A POS
Antibody Screen: NEGATIVE

## 2019-03-26 SURGERY — ARTHROPLASTY, KNEE, TOTAL
Anesthesia: Spinal | Laterality: Left

## 2019-03-26 MED ORDER — MIDAZOLAM HCL 2 MG/2ML IJ SOLN: 1.0000 mg | Freq: Once | INTRAMUSCULAR | Status: DC

## 2019-03-26 MED ORDER — CEFAZOLIN SODIUM-DEXTROSE 2-4 GM/100ML-% IV SOLN: 2.0000 g | INTRAVENOUS | Status: DC

## 2019-03-26 MED ORDER — CHLORHEXIDINE GLUCONATE 4 % EX LIQD: 60.0000 mL | Freq: Once | CUTANEOUS | Status: DC

## 2019-03-26 MED ORDER — TRANEXAMIC ACID-NACL 1000-0.7 MG/100ML-% IV SOLN: 1000.0000 mg | INTRAVENOUS | Status: DC

## 2019-03-26 MED ORDER — LACTATED RINGERS IV SOLN: INTRAVENOUS | Status: DC

## 2019-03-26 MED ORDER — FENTANYL CITRATE (PF) 100 MCG/2ML IJ SOLN: 50.0000 ug | Freq: Once | INTRAMUSCULAR | Status: DC

## 2019-03-26 MED ORDER — POVIDONE-IODINE 10 % EX SWAB: 2.0000 "application " | Freq: Once | CUTANEOUS | Status: DC

## 2019-03-26 NOTE — Progress Notes (Signed)
RN and Dr. Fransisco Beau at bedside to discuss with patient procedure needs to be rescheduled to due not having cardiac clearance. Pt very understanding.

## 2019-03-26 NOTE — Anesthesia Preprocedure Evaluation (Deleted)
Anesthesia Evaluation    Reviewed: Allergy & Precautions, Patient's Chart, lab work & pertinent test results  History of Anesthesia Complications Negative for: history of anesthetic complications  Airway        Dental   Pulmonary former smoker,           Cardiovascular hypertension, Pt. on medications and Pt. 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

## 2019-04-13 ENCOUNTER — Other Ambulatory Visit: Payer: Self-pay | Admitting: Orthopedic Surgery

## 2019-04-13 NOTE — Care Plan (Signed)
Spoke with patient prior to surgery. He will discharge to home with family and HHPT. He has all needed equipment at home. Questions answered. Will transition to OPPT after follow up with MD. Patient and MD agreeable with plan.   CHoice offered.    Ladell Heads, [place]

## 2019-04-18 ENCOUNTER — Other Ambulatory Visit (HOSPITAL_COMMUNITY): Payer: Medicare Other

## 2019-04-18 NOTE — Patient Instructions (Signed)
YOU NEED TO HAVE A COVID 19 TEST ON__8/6_____ @_11 :40______, THIS TEST MUST BE DONE BEFORE SURGERY, COME  [address], [place] , [id number]. ONCE YOUR COVID TEST IS COMPLETED, PLEASE BEGIN THE QUARANTINE INSTRUCTIONS AS OUTLINED IN YOUR HANDOUT.                Jordan FordyceRoger Dale Latella    Your procedure is scheduled on:  Monday 04/23/19   [facility]  Entrance Report to admitting at 9:10 AM   1 VISITOR IS ALLOWED TO WAIT IN WAITING ROOM  ONLY DAY OF YOUR SURGERY.  No family in short stay at this time   Call this number if you have problems the morning of surgery [phone]       BRUSH YOUR TEETH MORNING OF SURGERY AND RINSE YOUR MOUTH OUT, NO CHEWING GUM CANDY OR MINTS.   Do not eat food After Midnight.  YOU MAY HAVE CLEAR LIQUIDS FROM MIDNIGHT UNTIL 8:30 AM.  At 8:30 AM  Please finish the prescribed Pre-Surgery Gatorade drink. Nothing by mouth after you finish the Gatorade drink !   CLEAR LIQUID DIET   Foods Allowed                                                                     Foods Excluded  Coffee and tea, regular and decaf                             liquids that you cannot  Plain Jell-O any favor except red or purple                                           see through such as: Fruit ices (not with fruit pulp)                                     milk, soups, orange juice  Iced Popsicles                                    All solid food Carbonated beverages, regular and diet                                    Cranberry, grape and apple juices Sports drinks like Gatorade Lightly seasoned clear broth or consume(fat free) Sugar, honey syrup  ___________________________________________________________________    Incentive Spirometer  An incentive spirometer is a tool that can help keep your lungs clear and active. This tool measures how well you are filling your lungs with each breath. Taking long deep breaths may help reverse or decrease  the chance of developing breathing (pulmonary) problems (especially infection) following:  A long period of time when you are unable to move or be active. BEFORE THE PROCEDURE   If the spirometer includes an indicator to show your best effort, your nurse  or respiratory therapist will set it to a desired goal.  If possible, sit up straight or lean slightly forward. Try not to slouch.  Hold the incentive spirometer in an upright position. INSTRUCTIONS FOR USE  1. Sit on the edge of your bed if possible, or sit up as far as you can in bed or on a chair. 2. Hold the incentive spirometer in an upright position. 3. Breathe out normally. 4. Place the mouthpiece in your mouth and seal your lips tightly around it. 5. Breathe in slowly and as deeply as possible, raising the piston or the ball toward the top of the column. 6. Hold your breath for 3-5 seconds or for as long as possible. Allow the piston or ball to fall to the bottom of the column. 7. Remove the mouthpiece from your mouth and breathe out normally. 8. Rest for a few seconds and repeat Steps 1 through 7 at least 10 times every 1-2 hours when you are awake. Take your time and take a few normal breaths between deep breaths. 9. The spirometer may include an indicator to show your best effort. Use the indicator as a goal to work toward during each repetition. 10. After each set of 10 deep breaths, practice coughing to be sure your lungs are clear. If you have an incision (the cut made at the time of surgery), support your incision when coughing by placing a pillow or rolled up towels firmly against it. Once you are able to get out of bed, walk around indoors and cough well. You may stop using the incentive spirometer when instructed by your caregiver.  RISKS AND COMPLICATIONS  Take your time so you do not get dizzy or light-headed.  If you are in pain, you may need to take or ask for pain medication before doing incentive spirometry. It is  harder to take a deep breath if you are having pain. AFTER USE  Rest and breathe slowly and easily.  It can be helpful to keep track of a log of your progress. Your caregiver can provide you with a simple table to help with this. If you are using the spirometer at home, follow these instructions: SEEK MEDICAL CARE IF:   You are having difficultly using the spirometer.  You have trouble using the spirometer as often as instructed.  Your pain medication is not giving enough relief while using the spirometer.  You develop fever of 100.5 F (38.1 C) or higher. SEEK IMMEDIATE MEDICAL CARE IF:   You cough up bloody sputum that had not been present before.  You develop fever of 102 F (38.9 C) or greater.  You develop worsening pain at or near the incision site. MAKE SURE YOU:   Understand these instructions.  Will watch your condition.  Will get help right away if you are not doing well or get worse. Document Released: 01/10/2007 Document Revised: 11/22/2011 Document Reviewed: 03/13/2007 ExitCare Patient Information 2014 [employer], [place].   ________________________________________________________________________   Take these medicines the morning of surgery with A SIP OF WATER: Metoprolol,prilosec   DO NOT TAKE ANY DIABETIC MEDICATIONS DAY OF YOUR SURGERY      How to Manage Your Diabetes Before and After Surgery  Why is it important to control my blood sugar before and after surgery? . Improving blood sugar levels before and after surgery helps healing and can limit problems. . A way of improving blood sugar control is eating a healthy diet by: o  Eating less sugar and carbohydrates  o  Increasing activity/exercise o  Talking with your doctor about reaching your blood sugar goals . High blood sugars (greater than 180 mg/dL) can raise your risk of infections and slow your recovery, so you will need to focus on controlling your diabetes during the weeks before  surgery. . Make sure that the doctor who takes care of your diabetes knows about your planned surgery including the date and location.  How do I manage my blood sugar before surgery? . Check your blood sugar at least 4 times a day, starting 2 days before surgery, to make sure that the level is not too high or low. o Check your blood sugar the morning of your surgery when you wake up and every 2 hours until you get to the Short Stay unit. . If your blood sugar is less than 70 mg/dL, you will need to treat for low blood sugar: o Do not take insulin. o Treat a low blood sugar (less than 70 mg/dL) with  cup of clear juice (cranberry or apple), 4 glucose tablets, OR glucose gel. o Recheck blood sugar in 15 minutes after treatment (to make sure it is greater than 70 mg/dL). If your blood sugar is not greater than 70 mg/dL on recheck, call [phone] for further instructions. . Report your blood sugar to the short stay nurse when you get to Short Stay.  . If you are admitted to the hospital after surgery: o Your blood sugar will be checked by the staff and you will probably be given insulin after surgery (instead of oral diabetes medicines) to make sure you have good blood sugar levels. o The goal for blood sugar control after surgery is 80-180 mg/dL.  Do not take oral diabetes medicines (pills) the morning of surgery.      You may not have any metal on your body including piercings   Do not wear jewelry, make-up, lotions, powders or perfumes, deodorant                        Men may shave face and neck.   Do not bring valuables to the hospital. [facility] IS NOT             RESPONSIBLE   FOR VALUABLES.  Contacts, dentures or bridgework may not be worn into surgery.       Patients discharged the day of surgery will not be allowed to drive home.  IF YOU ARE HAVING SURGERY AND GOING HOME THE SAME DAY, YOU MUST HAVE AN ADULT TO DRIVE YOU HOME AND BE WITH YOU FOR 24 HOURS.  YOU MAY GO HOME  BY TAXI OR UBER OR ORTHERWISE, BUT AN ADULT MUST ACCOMPANY YOU HOME AND STAY WITH YOU FOR 24 HOURS.  Name and phone number of your driver:  Special Instructions: N/A              Please read over the following fact sheets you were given: _____________________________________________________________________             [facility] Pre[facility] Before surgery, you can play an important role.   Because skin is not sterile, your skin needs to be as free of germs as possible.   You can reduce the number of germs on your skin by washing with CHG (chlorahexidine gluconate) soap before surgery.   CHG is an antiseptic cleaner which kills germs and bonds with the skin to continue killing germs even after washing. Please DO  NOT use if you have an allergy to CHG or antibacterial soaps .  If your skin becomes reddened/irritated stop using the CHG and inform your nurse when you arrive at Short Stay. .  You may shave your face/neck. Please follow these instructions carefully:  1.  Shower with CHG Soap the night before surgery and the  morning of Surgery.  2.  If you choose to wash your hair, wash your hair first as usual with your  normal  shampoo.  3.  After you shampoo, rinse your hair and body thoroughly to remove the  shampoo.                                        4.  Use CHG as you would any other liquid soap.  You can apply chg directly  to the skin and wash                       Gently with a scrungie or clean washcloth.  5.  Apply the CHG Soap to your body ONLY FROM THE NECK DOWN.   Do not use on face/ open                           Wound or open sores. Avoid contact with eyes, ears mouth and genitals (private parts).                       Wash face,  Genitals (private parts) with your normal soap.             6.  Wash thoroughly, paying special attention to the area where your surgery  will be performed.  7.  Thoroughly rinse your body with warm water from the neck down.  8.  DO  NOT shower/wash with your normal soap after using and rinsing off  the CHG Soap.             9.  Pat yourself dry with a clean towel.            10.  Wear clean pajamas.            11.  Place clean sheets on your bed the night of your first shower and do not  sleep with pets.  Day of Surgery : Do not apply any lotions/deodorants the morning of surgery.  Please wear clean clothes to the hospital/surgery center.   FAILURE TO FOLLOW THESE INSTRUCTIONS MAY RESULT IN THE CANCELLATION OF YOUR SURGERY PATIENT SIGNATURE_________________________________  NURSE SIGNATURE__________________________________  ________________________________________________________________________

## 2019-04-19 ENCOUNTER — Other Ambulatory Visit: Payer: Self-pay | Admitting: Orthopedic Surgery

## 2019-04-19 ENCOUNTER — Encounter (HOSPITAL_COMMUNITY): Payer: Self-pay

## 2019-04-19 ENCOUNTER — Encounter (HOSPITAL_COMMUNITY)
Admission: RE | Admit: 2019-04-19 | Discharge: 2019-04-19 | Disposition: A | Discharge status: Home / Self Care | Payer: Medicare Other | Source: Ambulatory Visit | Attending: Orthopedic Surgery | Admitting: Orthopedic Surgery

## 2019-04-19 ENCOUNTER — Other Ambulatory Visit: Payer: Self-pay

## 2019-04-19 ENCOUNTER — Other Ambulatory Visit (HOSPITAL_COMMUNITY)
Admission: RE | Admit: 2019-04-19 | Discharge: 2019-04-19 | Disposition: A | Discharge status: Home / Self Care | Payer: Medicare Other | Source: Ambulatory Visit | Attending: Orthopedic Surgery | Admitting: Orthopedic Surgery

## 2019-04-19 DIAGNOSIS — M1712 Unilateral primary osteoarthritis, left knee: Secondary | ICD-10-CM | POA: Diagnosis not present

## 2019-04-19 DIAGNOSIS — Z20828 Contact with and (suspected) exposure to other viral communicable diseases: Secondary | ICD-10-CM | POA: Diagnosis not present

## 2019-04-19 DIAGNOSIS — Z01812 Encounter for preprocedural laboratory examination: Secondary | ICD-10-CM | POA: Insufficient documentation

## 2019-04-19 LAB — HEMOGLOBIN A1C
Hgb A1c MFr Bld: 6 % — ABNORMAL HIGH (ref 4.8–5.6)
Mean Plasma Glucose: 125.5 mg/dL

## 2019-04-19 LAB — BASIC METABOLIC PANEL
Anion gap: 9 (ref 5–15)
BUN: 28 mg/dL — ABNORMAL HIGH (ref 8–23)
CO2: 24 mmol/L (ref 22–32)
Calcium: 9.5 mg/dL (ref 8.9–10.3)
Chloride: 104 mmol/L (ref 98–111)
Creatinine, Ser: 0.95 mg/dL (ref 0.61–1.24)
GFR calc Af Amer: >60 mL/min (ref >60)
GFR calc non Af Amer: >60 mL/min (ref >60)
Glucose, Bld: 178 mg/dL — ABNORMAL HIGH (ref 70–99)
Potassium: 3.9 mmol/L (ref 3.5–5.1)
Sodium: 137 mmol/L (ref 135–145)

## 2019-04-19 LAB — GLUCOSE, CAPILLARY: Glucose-Capillary: 176 mg/dL — ABNORMAL HIGH (ref 70–99)

## 2019-04-19 LAB — CBC
HCT: 45.5 % (ref 39.0–52.0)
Hemoglobin: 15.1 g/dL (ref 13.0–17.0)
MCH: 32.4 pg (ref 26.0–34.0)
MCHC: 33.2 g/dL (ref 30.0–36.0)
MCV: 97.6 fL (ref 80.0–100.0)
Platelets: 211 10*3/uL (ref 150–400)
RBC: 4.66 MIL/uL (ref 4.22–5.81)
RDW: 12.8 % (ref 11.5–15.5)
WBC: 6.5 10*3/uL (ref 4.0–10.5)
nRBC: 0 % (ref 0.0–0.2)

## 2019-04-19 LAB — SARS CORONAVIRUS 2 (TAT 6-24 HRS): SARS Coronavirus 2: NEGATIVE

## 2019-04-19 LAB — SURGICAL PCR SCREEN
MRSA, PCR: NEGATIVE
Staphylococcus aureus: NEGATIVE
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
## 2019-04-20 ENCOUNTER — Telehealth (HOSPITAL_COMMUNITY): Payer: Self-pay | Admitting: *Deleted

## 2019-04-20 NOTE — Anesthesia Preprocedure Evaluation (Addendum)
Anesthesia Evaluation  Patient identified by MRN, date of birth, ID band Patient awake    Reviewed: Allergy & Precautions, NPO status , Patient's Chart, lab work & pertinent test results, reviewed documented beta blocker date and time   History of Anesthesia Complications (+) history of anesthetic complications  Airway Mallampati: II  TM Distance: >3 FB Neck ROM: Full    Dental  (+) Missing, Teeth Intact   Pulmonary neg pulmonary ROS, former smoker,    Pulmonary exam normal breath sounds clear to auscultation       Cardiovascular hypertension, Pt. on medications and Pt. on home beta blockers + CAD, + CABG and + Peripheral Vascular Disease  Normal cardiovascular exam Rhythm:Regular Rate:Normal + Systolic murmurs    Neuro/Psych negative neurological ROS  negative psych ROS   GI/Hepatic Neg liver ROS, hiatal hernia, GERD  Medicated and Controlled,  Endo/Other  diabetes, Well Controlled, Type 2, Oral Hypoglycemic AgentsHyperlipidemia Obesity  Renal/GU negative Renal ROS  negative genitourinary   Musculoskeletal  (+) Arthritis , Osteoarthritis,    Abdominal (+) + obese,   Peds  Hematology negative hematology ROS (+)   Anesthesia Other Findings   Reproductive/Obstetrics                           Anesthesia Physical Anesthesia Plan  ASA: III  Anesthesia Plan: Spinal   Post-op Pain Management:    Induction:   PONV Risk Score and Plan: 3 and Ondansetron, Dexamethasone and Midazolam  Airway Management Planned: Natural Airway, Nasal Cannula and Simple Face Mask  Additional Equipment:   Intra-op Plan:   Post-operative Plan:   Informed Consent: I have reviewed the patients History and Physical, chart, labs and discussed the procedure including the risks, benefits and alternatives for the proposed anesthesia with the patient or authorized representative who has indicated his/her  understanding and acceptance.     Dental advisory given  Plan Discussed with: CRNA and Surgeon  Anesthesia Plan Comments: (See PAT note 04/19/2019, Konrad Felix, PA-C)     Anesthesia Quick Evaluation
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
## 2019-04-22 MED ORDER — BUPIVACAINE LIPOSOME 1.3 % IJ SUSP
20.0000 mL | INTRAMUSCULAR | Status: DC
  Filled 2019-04-22: qty 20

## 2019-04-22 MED ORDER — TRANEXAMIC ACID 1000 MG/10ML IV SOLN
2000.0000 mg | INTRAVENOUS | Status: DC
  Filled 2019-04-22: qty 20

## 2019-04-23 ENCOUNTER — Other Ambulatory Visit: Payer: Self-pay

## 2019-04-23 ENCOUNTER — Ambulatory Visit (HOSPITAL_COMMUNITY): Payer: Medicare Other | Admitting: Certified Registered Nurse Anesthetist

## 2019-04-23 ENCOUNTER — Encounter (HOSPITAL_COMMUNITY): Payer: Self-pay | Admitting: Anesthesiology

## 2019-04-23 ENCOUNTER — Ambulatory Visit (HOSPITAL_COMMUNITY): Payer: Medicare Other | Admitting: Physician Assistant

## 2019-04-23 ENCOUNTER — Encounter (HOSPITAL_COMMUNITY)
Admission: RE | Disposition: A | Discharge status: Home / Self Care | Payer: Self-pay | Source: Home / Self Care | Attending: Orthopedic Surgery

## 2019-04-23 ENCOUNTER — Observation Stay (HOSPITAL_COMMUNITY)
Admission: RE | Admit: 2019-04-23 | Discharge: 2019-04-24 | Disposition: A | Discharge status: Home / Self Care | Payer: Medicare Other | Attending: Orthopedic Surgery | Admitting: Orthopedic Surgery

## 2019-04-23 DIAGNOSIS — K219 Gastro-esophageal reflux disease without esophagitis: Secondary | ICD-10-CM | POA: Diagnosis not present

## 2019-04-23 DIAGNOSIS — E7800 Pure hypercholesterolemia, unspecified: Secondary | ICD-10-CM | POA: Insufficient documentation

## 2019-04-23 DIAGNOSIS — Z79899 Other long term (current) drug therapy: Secondary | ICD-10-CM | POA: Diagnosis not present

## 2019-04-23 DIAGNOSIS — E1151 Type 2 diabetes mellitus with diabetic peripheral angiopathy without gangrene: Secondary | ICD-10-CM | POA: Insufficient documentation

## 2019-04-23 DIAGNOSIS — Z7984 Long term (current) use of oral hypoglycemic drugs: Secondary | ICD-10-CM | POA: Insufficient documentation

## 2019-04-23 DIAGNOSIS — M109 Gout, unspecified: Secondary | ICD-10-CM | POA: Insufficient documentation

## 2019-04-23 DIAGNOSIS — Z01818 Encounter for other preprocedural examination: Secondary | ICD-10-CM

## 2019-04-23 DIAGNOSIS — Z87891 Personal history of nicotine dependence: Secondary | ICD-10-CM | POA: Insufficient documentation

## 2019-04-23 DIAGNOSIS — E669 Obesity, unspecified: Secondary | ICD-10-CM | POA: Diagnosis not present

## 2019-04-23 DIAGNOSIS — Z7982 Long term (current) use of aspirin: Secondary | ICD-10-CM | POA: Insufficient documentation

## 2019-04-23 DIAGNOSIS — E785 Hyperlipidemia, unspecified: Secondary | ICD-10-CM | POA: Diagnosis not present

## 2019-04-23 DIAGNOSIS — Z951 Presence of aortocoronary bypass graft: Secondary | ICD-10-CM | POA: Insufficient documentation

## 2019-04-23 DIAGNOSIS — M170 Bilateral primary osteoarthritis of knee: Secondary | ICD-10-CM | POA: Diagnosis not present

## 2019-04-23 DIAGNOSIS — I10 Essential (primary) hypertension: Secondary | ICD-10-CM | POA: Insufficient documentation

## 2019-04-23 DIAGNOSIS — M1712 Unilateral primary osteoarthritis, left knee: Secondary | ICD-10-CM | POA: Diagnosis present

## 2019-04-23 DIAGNOSIS — I2510 Atherosclerotic heart disease of native coronary artery without angina pectoris: Secondary | ICD-10-CM | POA: Diagnosis not present

## 2019-04-23 DIAGNOSIS — Z6835 Body mass index (BMI) 35.0-35.9, adult: Secondary | ICD-10-CM | POA: Insufficient documentation

## 2019-04-23 DIAGNOSIS — D62 Acute posthemorrhagic anemia: Secondary | ICD-10-CM | POA: Diagnosis not present

## 2019-04-23 DIAGNOSIS — Z96652 Presence of left artificial knee joint: Secondary | ICD-10-CM

## 2019-04-23 DIAGNOSIS — Z791 Long term (current) use of non-steroidal anti-inflammatories (NSAID): Secondary | ICD-10-CM | POA: Insufficient documentation

## 2019-04-23 HISTORY — DX: Dyspnea, unspecified: R06.00

## 2019-04-23 HISTORY — PX: TOTAL KNEE ARTHROPLASTY: SHX125

## 2019-04-23 LAB — TYPE AND SCREEN
ABO/RH(D): A POS
Antibody Screen: NEGATIVE

## 2019-04-23 LAB — GLUCOSE, CAPILLARY
Glucose-Capillary: 116 mg/dL — ABNORMAL HIGH (ref 70–99)
Glucose-Capillary: 158 mg/dL — ABNORMAL HIGH (ref 70–99)
Glucose-Capillary: 265 mg/dL — ABNORMAL HIGH (ref 70–99)

## 2019-04-23 SURGERY — ARTHROPLASTY, KNEE, TOTAL
Anesthesia: Spinal | Site: Knee | Laterality: Left

## 2019-04-23 MED ORDER — 0.9 % SODIUM CHLORIDE (POUR BTL) OPTIME
TOPICAL | Status: DC | PRN
  Administered 2019-04-23: 1000 mL

## 2019-04-23 MED ORDER — ONDANSETRON HCL 4 MG/2ML IJ SOLN
INTRAMUSCULAR | Status: AC
  Filled 2019-04-23: qty 2

## 2019-04-23 MED ORDER — FLEET ENEMA 7-19 GM/118ML RE ENEM: 1.0000 | ENEMA | Freq: Once | RECTAL | Status: DC | PRN

## 2019-04-23 MED ORDER — LISINOPRIL 20 MG PO TABS
40.0000 mg | ORAL_TABLET | Freq: Every day | ORAL | Status: DC
  Administered 2019-04-24: 40 mg via ORAL
  Filled 2019-04-23: qty 2

## 2019-04-23 MED ORDER — METOCLOPRAMIDE HCL 5 MG/ML IJ SOLN: 5.0000 mg | Freq: Three times a day (TID) | INTRAMUSCULAR | Status: DC | PRN

## 2019-04-23 MED ORDER — POLYETHYLENE GLYCOL 3350 17 G PO PACK: 17.0000 g | PACK | Freq: Every day | ORAL | Status: DC | PRN

## 2019-04-23 MED ORDER — INSULIN ASPART 100 UNIT/ML ~~LOC~~ SOLN
0.0000 [IU] | Freq: Three times a day (TID) | SUBCUTANEOUS | Status: DC
  Administered 2019-04-24: 3 [IU] via SUBCUTANEOUS

## 2019-04-23 MED ORDER — MELOXICAM 15 MG PO TABS
15.0000 mg | ORAL_TABLET | Freq: Every day | ORAL | Status: DC
  Administered 2019-04-23: 15 mg via ORAL
  Filled 2019-04-23 (×2): qty 1

## 2019-04-23 MED ORDER — PROPOFOL 10 MG/ML IV BOLUS
INTRAVENOUS | Status: AC
  Filled 2019-04-23: qty 20

## 2019-04-23 MED ORDER — MENTHOL 3 MG MT LOZG: 1.0000 | LOZENGE | OROMUCOSAL | Status: DC | PRN

## 2019-04-23 MED ORDER — ROSUVASTATIN CALCIUM 20 MG PO TABS
20.0000 mg | ORAL_TABLET | Freq: Every evening | ORAL | Status: DC
  Administered 2019-04-23: 20 mg via ORAL
  Filled 2019-04-23: qty 1

## 2019-04-23 MED ORDER — TRANEXAMIC ACID-NACL 1000-0.7 MG/100ML-% IV SOLN
1000.0000 mg | INTRAVENOUS | Status: AC
  Administered 2019-04-23: 1000 mg via INTRAVENOUS
  Filled 2019-04-23: qty 100

## 2019-04-23 MED ORDER — PANTOPRAZOLE SODIUM 40 MG PO TBEC
40.0000 mg | DELAYED_RELEASE_TABLET | Freq: Every day | ORAL | Status: DC
  Administered 2019-04-24: 40 mg via ORAL
  Filled 2019-04-23: qty 1

## 2019-04-23 MED ORDER — HYDROMORPHONE HCL 1 MG/ML IJ SOLN: 0.5000 mg | INTRAMUSCULAR | Status: DC | PRN

## 2019-04-23 MED ORDER — PROPOFOL 10 MG/ML IV BOLUS
INTRAVENOUS | Status: DC | PRN
  Administered 2019-04-23: 20 mg via INTRAVENOUS
  Administered 2019-04-23: 30 mg via INTRAVENOUS
  Administered 2019-04-23 (×3): 20 mg via INTRAVENOUS
  Administered 2019-04-23: 30 mg via INTRAVENOUS
  Administered 2019-04-23 (×2): 20 mg via INTRAVENOUS

## 2019-04-23 MED ORDER — GLIPIZIDE ER 5 MG PO TB24
5.0000 mg | ORAL_TABLET | Freq: Every day | ORAL | Status: DC
  Administered 2019-04-24: 5 mg via ORAL
  Filled 2019-04-23: qty 1

## 2019-04-23 MED ORDER — PROMETHAZINE HCL 25 MG/ML IJ SOLN: 12.5000 mg | Freq: Once | INTRAMUSCULAR | Status: DC | PRN

## 2019-04-23 MED ORDER — ASPIRIN EC 81 MG PO TBEC: 81.0000 mg | DELAYED_RELEASE_TABLET | Freq: Two times a day (BID) | ORAL | 0 refills | Status: DC

## 2019-04-23 MED ORDER — BUPIVACAINE IN DEXTROSE 0.75-8.25 % IT SOLN
INTRATHECAL | Status: DC | PRN
  Administered 2019-04-23: 1.8 mL via INTRATHECAL

## 2019-04-23 MED ORDER — TRANEXAMIC ACID-NACL 1000-0.7 MG/100ML-% IV SOLN
1000.0000 mg | Freq: Once | INTRAVENOUS | Status: AC
  Administered 2019-04-23: 17:00:00 1000 mg via INTRAVENOUS
  Filled 2019-04-23: qty 100

## 2019-04-23 MED ORDER — METFORMIN HCL 500 MG PO TABS
500.0000 mg | ORAL_TABLET | Freq: Two times a day (BID) | ORAL | Status: DC
  Administered 2019-04-23 – 2019-04-24 (×2): 500 mg via ORAL
  Filled 2019-04-23 (×2): qty 1

## 2019-04-23 MED ORDER — KCL IN DEXTROSE-NACL 20-5-0.45 MEQ/L-%-% IV SOLN
INTRAVENOUS | Status: DC
  Administered 2019-04-23 – 2019-04-24 (×2): via INTRAVENOUS
  Filled 2019-04-23 (×3): qty 1000

## 2019-04-23 MED ORDER — VITAMIN D 25 MCG (1000 UNIT) PO TABS
5000.0000 [IU] | ORAL_TABLET | Freq: Every day | ORAL | Status: DC
  Administered 2019-04-24: 5000 [IU] via ORAL
  Filled 2019-04-23: qty 5

## 2019-04-23 MED ORDER — BUPIVACAINE-EPINEPHRINE (PF) 0.5% -1:200000 IJ SOLN
INTRAMUSCULAR | Status: DC | PRN
  Administered 2019-04-23: 30 mL

## 2019-04-23 MED ORDER — TRANEXAMIC ACID 1000 MG/10ML IV SOLN
INTRAVENOUS | Status: DC | PRN
  Administered 2019-04-23: 2000 mg via TOPICAL

## 2019-04-23 MED ORDER — BISACODYL 5 MG PO TBEC: 5.0000 mg | DELAYED_RELEASE_TABLET | Freq: Every day | ORAL | Status: DC | PRN

## 2019-04-23 MED ORDER — ONDANSETRON HCL 4 MG/2ML IJ SOLN
INTRAMUSCULAR | Status: DC | PRN
  Administered 2019-04-23: 4 mg via INTRAVENOUS

## 2019-04-23 MED ORDER — ONDANSETRON HCL 4 MG/2ML IJ SOLN: 4.0000 mg | Freq: Four times a day (QID) | INTRAMUSCULAR | Status: DC | PRN

## 2019-04-23 MED ORDER — SODIUM CHLORIDE (PF) 0.9 % IJ SOLN
INTRAMUSCULAR | Status: DC | PRN
  Administered 2019-04-23: 70 mL

## 2019-04-23 MED ORDER — FENTANYL CITRATE (PF) 100 MCG/2ML IJ SOLN: 25.0000 ug | INTRAMUSCULAR | Status: DC | PRN

## 2019-04-23 MED ORDER — PROPOFOL 500 MG/50ML IV EMUL
INTRAVENOUS | Status: DC | PRN
  Administered 2019-04-23: 50 ug/kg/min via INTRAVENOUS

## 2019-04-23 MED ORDER — METHOCARBAMOL 500 MG PO TABS
500.0000 mg | ORAL_TABLET | Freq: Four times a day (QID) | ORAL | Status: DC | PRN
  Administered 2019-04-23 – 2019-04-24 (×2): 500 mg via ORAL
  Filled 2019-04-23 (×2): qty 1

## 2019-04-23 MED ORDER — METOCLOPRAMIDE HCL 5 MG PO TABS: 5.0000 mg | ORAL_TABLET | Freq: Three times a day (TID) | ORAL | Status: DC | PRN

## 2019-04-23 MED ORDER — FOLIC ACID 1 MG PO TABS
1.0000 mg | ORAL_TABLET | Freq: Every day | ORAL | Status: DC
  Administered 2019-04-24: 1 mg via ORAL
  Filled 2019-04-23: qty 1

## 2019-04-23 MED ORDER — DEXAMETHASONE SODIUM PHOSPHATE 10 MG/ML IJ SOLN
INTRAMUSCULAR | Status: AC
  Filled 2019-04-23: qty 1

## 2019-04-23 MED ORDER — BUPIVACAINE-EPINEPHRINE (PF) 0.25% -1:200000 IJ SOLN
INTRAMUSCULAR | Status: DC | PRN
  Administered 2019-04-23: 30 mL via PERINEURAL

## 2019-04-23 MED ORDER — SODIUM CHLORIDE (PF) 0.9 % IJ SOLN
INTRAMUSCULAR | Status: AC
  Filled 2019-04-23: qty 20

## 2019-04-23 MED ORDER — DEXAMETHASONE SODIUM PHOSPHATE 10 MG/ML IJ SOLN
10.0000 mg | Freq: Once | INTRAMUSCULAR | Status: AC
  Administered 2019-04-24: 10 mg via INTRAVENOUS
  Filled 2019-04-23: qty 1

## 2019-04-23 MED ORDER — METHOCARBAMOL 500 MG IVPB - SIMPLE MED
500.0000 mg | Freq: Four times a day (QID) | INTRAVENOUS | Status: DC | PRN
  Filled 2019-04-23: qty 50

## 2019-04-23 MED ORDER — GABAPENTIN 300 MG PO CAPS
300.0000 mg | ORAL_CAPSULE | Freq: Three times a day (TID) | ORAL | Status: DC
  Administered 2019-04-23 – 2019-04-24 (×3): 300 mg via ORAL
  Filled 2019-04-23 (×3): qty 1

## 2019-04-23 MED ORDER — ACETAMINOPHEN 325 MG PO TABS: 325.0000 mg | ORAL_TABLET | Freq: Four times a day (QID) | ORAL | Status: DC | PRN

## 2019-04-23 MED ORDER — DIPHENHYDRAMINE HCL 12.5 MG/5ML PO ELIX: 12.5000 mg | ORAL_SOLUTION | ORAL | Status: DC | PRN

## 2019-04-23 MED ORDER — OXYCODONE HCL 5 MG PO TABS
5.0000 mg | ORAL_TABLET | ORAL | Status: DC | PRN
  Administered 2019-04-23: 5 mg via ORAL
  Administered 2019-04-23 – 2019-04-24 (×3): 10 mg via ORAL
  Filled 2019-04-23: qty 2
  Filled 2019-04-23: qty 1
  Filled 2019-04-23 (×2): qty 2

## 2019-04-23 MED ORDER — HYDROCHLOROTHIAZIDE 25 MG PO TABS
25.0000 mg | ORAL_TABLET | Freq: Every day | ORAL | Status: DC
  Administered 2019-04-24: 09:00:00 25 mg via ORAL
  Filled 2019-04-23: qty 1

## 2019-04-23 MED ORDER — POVIDONE-IODINE 10 % EX SWAB: 2.0000 "application " | Freq: Once | CUTANEOUS | Status: DC

## 2019-04-23 MED ORDER — PHENOL 1.4 % MT LIQD: 1.0000 | OROMUCOSAL | Status: DC | PRN

## 2019-04-23 MED ORDER — MIDAZOLAM HCL 2 MG/2ML IJ SOLN
1.0000 mg | INTRAMUSCULAR | Status: DC
  Administered 2019-04-23: 1 mg via INTRAVENOUS
  Filled 2019-04-23: qty 2

## 2019-04-23 MED ORDER — SODIUM CHLORIDE 0.9 % IV SOLN
INTRAVENOUS | Status: DC | PRN
  Administered 2019-04-23: 40 ug/min via INTRAVENOUS

## 2019-04-23 MED ORDER — ONDANSETRON HCL 4 MG PO TABS: 4.0000 mg | ORAL_TABLET | Freq: Four times a day (QID) | ORAL | Status: DC | PRN

## 2019-04-23 MED ORDER — FENTANYL CITRATE (PF) 100 MCG/2ML IJ SOLN
50.0000 ug | INTRAMUSCULAR | Status: DC
  Administered 2019-04-23: 50 ug via INTRAVENOUS
  Filled 2019-04-23: qty 2

## 2019-04-23 MED ORDER — BUPIVACAINE LIPOSOME 1.3 % IJ SUSP
INTRAMUSCULAR | Status: DC | PRN
  Administered 2019-04-23: 20 mL

## 2019-04-23 MED ORDER — ALLOPURINOL 100 MG PO TABS
100.0000 mg | ORAL_TABLET | Freq: Every day | ORAL | Status: DC
  Administered 2019-04-23 – 2019-04-24 (×2): 100 mg via ORAL
  Filled 2019-04-23 (×2): qty 1

## 2019-04-23 MED ORDER — ALUM & MAG HYDROXIDE-SIMETH 200-200-20 MG/5ML PO SUSP: 30.0000 mL | ORAL | Status: DC | PRN

## 2019-04-23 MED ORDER — CEFAZOLIN SODIUM-DEXTROSE 2-4 GM/100ML-% IV SOLN
2.0000 g | INTRAVENOUS | Status: AC
  Administered 2019-04-23: 12:00:00 2 g via INTRAVENOUS
  Filled 2019-04-23: qty 100

## 2019-04-23 MED ORDER — TIZANIDINE HCL 2 MG PO TABS: 2.0000 mg | ORAL_TABLET | Freq: Four times a day (QID) | ORAL | 0 refills | Status: DC | PRN

## 2019-04-23 MED ORDER — PANTOPRAZOLE SODIUM 40 MG PO TBEC: 40.0000 mg | DELAYED_RELEASE_TABLET | Freq: Every day | ORAL | Status: DC

## 2019-04-23 MED ORDER — SODIUM CHLORIDE 0.9 % IR SOLN
Status: DC | PRN
  Administered 2019-04-23: 1000 mL

## 2019-04-23 MED ORDER — METOPROLOL SUCCINATE ER 50 MG PO TB24
100.0000 mg | ORAL_TABLET | Freq: Every day | ORAL | Status: DC
  Administered 2019-04-24: 100 mg via ORAL
  Filled 2019-04-23: qty 2

## 2019-04-23 MED ORDER — DOCUSATE SODIUM 100 MG PO CAPS
100.0000 mg | ORAL_CAPSULE | Freq: Two times a day (BID) | ORAL | Status: DC
  Administered 2019-04-23 – 2019-04-24 (×2): 100 mg via ORAL
  Filled 2019-04-23 (×2): qty 1

## 2019-04-23 MED ORDER — OXYCODONE-ACETAMINOPHEN 5-325 MG PO TABS: 1.0000 | ORAL_TABLET | ORAL | 0 refills | Status: DC | PRN

## 2019-04-23 MED ORDER — ASPIRIN 81 MG PO CHEW
81.0000 mg | CHEWABLE_TABLET | Freq: Two times a day (BID) | ORAL | Status: DC
  Administered 2019-04-23 – 2019-04-24 (×2): 81 mg via ORAL
  Filled 2019-04-23 (×2): qty 1

## 2019-04-23 MED ORDER — PHENYLEPHRINE HCL (PRESSORS) 10 MG/ML IV SOLN
INTRAVENOUS | Status: AC
  Filled 2019-04-23: qty 1

## 2019-04-23 MED ORDER — PROPOFOL 10 MG/ML IV BOLUS
INTRAVENOUS | Status: AC
  Filled 2019-04-23: qty 40

## 2019-04-23 MED ORDER — SODIUM CHLORIDE (PF) 0.9 % IJ SOLN
INTRAMUSCULAR | Status: AC
  Filled 2019-04-23: qty 50

## 2019-04-23 MED ORDER — DEXAMETHASONE SODIUM PHOSPHATE 10 MG/ML IJ SOLN
INTRAMUSCULAR | Status: DC | PRN
  Administered 2019-04-23: 10 mg via INTRAVENOUS

## 2019-04-23 MED ORDER — BUPIVACAINE-EPINEPHRINE (PF) 0.25% -1:200000 IJ SOLN
INTRAMUSCULAR | Status: AC
  Filled 2019-04-23: qty 30

## 2019-04-23 MED ORDER — CHLORHEXIDINE GLUCONATE 4 % EX LIQD: 60.0000 mL | Freq: Once | CUTANEOUS | Status: DC

## 2019-04-23 MED ORDER — LACTATED RINGERS IV SOLN
INTRAVENOUS | Status: DC
  Administered 2019-04-23 (×2): via INTRAVENOUS

## 2019-04-23 MED ORDER — WATER FOR IRRIGATION, STERILE IR SOLN
Status: DC | PRN
  Administered 2019-04-23: 2000 mL

## 2019-04-23 MED ORDER — PROPOFOL 10 MG/ML IV BOLUS
INTRAVENOUS | Status: AC
  Filled 2019-04-23: qty 60

## 2019-04-23 SURGICAL SUPPLY — 50 items
ATTUNE MED DOME PAT 38 KNEE (Knees) ×2 IMPLANT
ATTUNE MED DOME PAT 38MM KNEE (Knees) ×1 IMPLANT
ATTUNE PS FEM LT SZ 8 CEM KNEE (Femur) ×3 IMPLANT
ATTUNE PSRP INSR SZ8 5 KNEE (Insert) ×2 IMPLANT
ATTUNE PSRP INSR SZ8 5MM KNEE (Insert) ×1 IMPLANT
BAG DECANTER FOR FLEXI CONT (MISCELLANEOUS) ×3 IMPLANT
BAG ZIPLOCK 12X15 (MISCELLANEOUS) ×3 IMPLANT
BASE TIBIAL ATTUNE KNEE SZ9 (Knees) ×1 IMPLANT
BLADE SAG 18X100X1.27 (BLADE) ×3 IMPLANT
BLADE SAW SGTL 11.0X1.19X90.0M (BLADE) ×3 IMPLANT
BLADE SURG SZ10 CARB STEEL (BLADE) IMPLANT
BNDG ELASTIC 6X10 VLCR STRL LF (GAUZE/BANDAGES/DRESSINGS) ×3 IMPLANT
BOWL SMART MIX CTS (DISPOSABLE) ×3 IMPLANT
CEMENT HV SMART SET (Cement) ×3 IMPLANT
COVER SURGICAL LIGHT HANDLE (MISCELLANEOUS) ×3 IMPLANT
COVER WAND RF STERILE (DRAPES) IMPLANT
CUFF TOURN SGL QUICK 34 (TOURNIQUET CUFF) ×2
CUFF TRNQT CYL 34X4.125X (TOURNIQUET CUFF) ×1 IMPLANT
DECANTER SPIKE VIAL GLASS SM (MISCELLANEOUS) ×9 IMPLANT
DRAPE U-SHAPE 47X51 STRL (DRAPES) ×3 IMPLANT
DRSG AQUACEL AG ADV 3.5X10 (GAUZE/BANDAGES/DRESSINGS) ×3 IMPLANT
DURAPREP 26ML APPLICATOR (WOUND CARE) ×3 IMPLANT
ELECT REM PT RETURN 15FT ADLT (MISCELLANEOUS) ×3 IMPLANT
GLOVE BIO SURGEON STRL SZ7.5 (GLOVE) ×3 IMPLANT
GLOVE BIO SURGEON STRL SZ8.5 (GLOVE) ×3 IMPLANT
GLOVE BIOGEL PI IND STRL 8 (GLOVE) ×1 IMPLANT
GLOVE BIOGEL PI IND STRL 9 (GLOVE) ×1 IMPLANT
GLOVE BIOGEL PI INDICATOR 8 (GLOVE) ×2
GLOVE BIOGEL PI INDICATOR 9 (GLOVE) ×2
GOWN STRL REUS W/TWL XL LVL3 (GOWN DISPOSABLE) ×6 IMPLANT
HANDPIECE INTERPULSE COAX TIP (DISPOSABLE) ×2
HOOD PEEL AWAY FLYTE STAYCOOL (MISCELLANEOUS) ×9 IMPLANT
KIT TURNOVER KIT A (KITS) IMPLANT
NEEDLE HYPO 21X1.5 SAFETY (NEEDLE) ×6 IMPLANT
NS IRRIG 1000ML POUR BTL (IV SOLUTION) ×3 IMPLANT
PACK ICE MAXI GEL EZY WRAP (MISCELLANEOUS) ×3 IMPLANT
PACK TOTAL KNEE CUSTOM (KITS) ×3 IMPLANT
PIN STEINMAN FIXATION KNEE (PIN) ×3 IMPLANT
PROTECTOR NERVE ULNAR (MISCELLANEOUS) ×3 IMPLANT
SET HNDPC FAN SPRY TIP SCT (DISPOSABLE) ×1 IMPLANT
SUT VIC AB 1 CTX 36 (SUTURE) ×2
SUT VIC AB 1 CTX36XBRD ANBCTR (SUTURE) ×1 IMPLANT
SUT VIC AB 3-0 CT1 27 (SUTURE) ×6
SUT VIC AB 3-0 CT1 TAPERPNT 27 (SUTURE) ×3 IMPLANT
SYR CONTROL 10ML LL (SYRINGE) ×6 IMPLANT
TIBIAL BASE ATTUNE KNEE SZ9 (Knees) ×3 IMPLANT
TRAY FOLEY MTR SLVR 16FR STAT (SET/KITS/TRAYS/PACK) ×3 IMPLANT
WATER STERILE IRR 1000ML POUR (IV SOLUTION) ×6 IMPLANT
WRAP KNEE MAXI GEL POST OP (GAUZE/BANDAGES/DRESSINGS) ×3 IMPLANT
YANKAUER SUCT BULB TIP 10FT TU (MISCELLANEOUS) ×3 IMPLANT

## 2019-04-23 NOTE — Op Note (Signed)
PATIENT ID:      Jordan FordyceRoger Dale Chaney  MRN:     [id number] DOB/AGE:    68-03-1951 / 68 y.o.       OPERATIVE REPORT    DATE OF PROCEDURE:  04/23/2019       PREOPERATIVE DIAGNOSIS:   LEFT  KNEE OSTEOARTHRITIS      Estimated body mass index is 35.87 kg/m as calculated from the following:   Height as of 04/19/19: 5\' 10"  (1.778 m).   Weight as of 04/19/19: 113.4 kg.                                                        POSTOPERATIVE DIAGNOSIS:   L Knee OA Valgus                                                                     PROCEDURE:  Procedure(s): LEFT Knee Arthroplasty Using DepuyAttune RP implants #8L Femur, #9Tibia, 5 mm Attune RP bearing, 38 Patella     SURGEON: Nestor LewandowskyFrank J Lacye Mccarn    ASSISTANT:   Tomi LikensEric K. [employer] PA-C   (Present and scrubbed throughout the case, critical for assistance with exposure, retraction, instrumentation, and closure.)         ANESTHESIA: Spinal, 20cc Exparel, 50cc 0.25% Marcaine  EBL: 300 cc  FLUID REPLACEMENT: 1600 cc crystaloid  Tourniquet Time: None  Drains: None  Tranexamic Acid: 1gm IV, 2gm topical  Exparel: 266mg    COMPLICATIONS:  None         INDICATIONS FOR PROCEDURE: The patient has  LEFT  KNEE OSTEOARTHRITIS, Val deformities, XR shows bone on bone arthritis, lateral subluxation of tibia. Patient has failed all conservative measures including anti-inflammatory medicines, narcotics, attempts at exercise and weight loss, cortisone injections and viscosupplementation.  Risks and benefits of surgery have been discussed, questions answered.   DESCRIPTION OF PROCEDURE: The patient identified by armband, received  IV antibiotics[facility]. Patient taken to the operating room, appropriate anesthetic monitors were attached, and Spinal anesthesia was  induced. IV Tranexamic acid was given.Tourniquet applied high to the operative thigh. Lateral post and foot positioner applied to the table, the lower extremity was then prepped and  draped in usual sterile fashion from the toes to the tourniquet. Time-out procedure was performed. The skin and subcutaneous tissue along the incision was injected with 20 cc of a mixture of Exparel and Marcaine solution, using a 20-gauge by 1-1/2 inch needle. We began the operation, with the knee flexed 130 degrees, by making the anterior midline incision starting at handbreadth above the patella going over the patella 1 cm medial to and 4 cm distal to the tibial tubercle. Small bleeders in the skin and the subcutaneous tissue identified and cauterized. Transverse retinaculum was incised and reflected medially and a medial parapatellar arthrotomy was accomplished. the patella was everted and theprepatellar fat pad resected. The superficial medial collateral ligament was then elevated from anterior to posterior along the proximal flare of the tibia and anterior half of the menisci resected. The knee was hyperflexed exposing bone  on bone arthritis. Peripheral and notch osteophytes as well as the cruciate ligaments were then resected. We continued to work our way around posteriorly along the proximal tibia, and externally rotated the tibia subluxing it out from underneath the femur. A McHale retractor was placed through the notch and a lateral Hohmann retractor placed, and we then drilled through the proximal tibia in line with the axis of the tibia followed by an intramedullary guide rod and 2-degree posterior slope cutting guide. The tibial cutting guide, 4 degree posterior sloped, was pinned into place allowing resection of 10 mm of bone medially and 3 mm of bone laterally. Satisfied with the tibial resection, we then entered the distal femur 2 mm anterior to the PCL origin with the intramedullary guide rod and applied the distal femoral cutting guide set at 9 mm, with 5 degrees of valgus. This was pinned along the epicondylar axis. At this point, the distal femoral cut was accomplished without difficulty. We then  sized for a #8L femoral component and pinned the guide in 0 degrees of external rotation. The chamfer cutting guide was pinned into place. The anterior, posterior, and chamfer cuts were accomplished without difficulty followed by the Attune RP box cutting guide and the box cut. We also removed posterior osteophytes from the posterior femoral condyles. The posterior capsule was injected with Exparel solution. The knee was brought into full extension. We checked our extension gap and fit a 5 mm bearing. Distracting in extension with a lamina spreader,  bleeders in the posterior capsule, Posterior medial and posterior lateral right down and cauterized.  The transexamic acid-soaked sponge was then placed in the gap of the knee and extension. The knee was flexed 30. The posterior patella cut was accomplished with the 9.5 mm Attune cutting guide, sized for a 38mm dome, and the fixation pegs drilled.The knee was then once again hyperflexed exposing the proximal tibia. We sized for a # 9 tibial base plate, applied the smokestack and the conical reamer followed by the the Delta fin keel punch. We then hammered into place the Attune RP trial femoral component, drilled the lugs, inserted a  5 mm trial bearing, trial patellar button, and took the knee through range of motion from 0-130 degrees. Medial and lateral ligamentous stability was checked. No thumb pressure was required for patellar Tracking. The tourniquet was not used. All trial components were removed, mating surfaces irrigated with pulse lavage, and dried with suction and sponges. 10 cc of the Exparel solution was applied to the cancellus bone of the patella distal femur and proximal tibia.  After waiting 30 seconds, the bony surfaces were again, dried with sponges. A double batch of DePuy HV cement was mixed and applied to all bony metallic mating surfaces except for the posterior condyles of the femur itself. In order, we hammered into place the tibial tray and  removed excess cement, the femoral component and removed excess cement. The final Attune RP bearing was inserted, and the knee brought to full extension with compression. The patellar button was clamped into place, and excess cement removed. The knee was held at 30 flexion with compression, while the cement cured. The wound was irrigated out with normal saline solution pulse lavage. The rest of the Exparel was injected into the parapatellar arthrotomy, subcutaneous tissues, and periosteal tissues. The parapatellar arthrotomy was closed with running #1 Vicryl suture. The subcutaneous tissue with 0 and 2-0 undyed Vicryl suture, and the skin with running 3-0 SQ vicryl. An Aquacil and  Ace wrap were applied. The patient was taken to recovery room without difficulty.   Kerin Salen 04/23/2019, 7:17 AM

## 2019-04-23 NOTE — Plan of Care (Signed)

## 2019-04-23 NOTE — Progress Notes (Signed)
AssistedDr. Foster with left, ultrasound guided, adductor canal block. Side rails up, monitors on throughout procedure. See vital signs in flow sheet. Tolerated Procedure well.  

## 2019-04-23 NOTE — Transfer of Care (Signed)
Immediate Anesthesia Transfer of Care Note  Patient: Bode Pieper  Procedure(s) Performed: LEFT Knee Arthroplasty (Left Knee)  Patient Location: PACU  Anesthesia Type:Spinal  Level of Consciousness: drowsy and patient cooperative  Airway & Oxygen Therapy: Patient Spontanous Breathing and Patient connected to face mask oxygen  Post-op Assessment: Report given to RN and Post -op Vital signs reviewed and stable  Post vital signs: Reviewed and stable  Last Vitals:  Vitals Value Taken Time  BP 104/69 04/23/19 1354  Temp    Pulse 69 04/23/19 1355  Resp 16 04/23/19 1355  SpO2 95 % 04/23/19 1355  Vitals shown include unvalidated device data.  Last Pain:  Vitals:   04/23/19 1015  TempSrc:   PainSc: 0-No pain      Patients Stated Pain Goal: 4 (67/61/95 0932)  Complications: No apparent anesthesia complications

## 2019-04-23 NOTE — Care Plan (Signed)
Ortho Bundle Case Management Note  Patient Details  Name: Jordan Chaney MRN: [id number] Date of Birth: 1951-09-10  Spoke with patient prior to surgery. He will discharge to home with family and HHPT. He has all needed equipment at home. Questions answered. Will transition to OPPT after follow up with MD. Patient and MD agreeable with plan.   CHoice offered.                    DME Arranged:    DME Agency:     HH Arranged:  PT HH Agency:  [facility] (formerly [facility])  Additional Comments: Please contact me with any questions of if this plan should need to change.  Ladell Heads,  [place] [facility]  [phone] 04/23/2019, 2:44 PM
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
## 2019-04-23 NOTE — Discharge Instructions (Signed)

## 2019-04-23 NOTE — Anesthesia Procedure Notes (Signed)
Spinal  Patient location during procedure: OR Start time: 04/23/2019 11:42 AM End time: 04/23/2019 11:47 AM Staffing Anesthesiologist: Josephine Igo, MD Resident/CRNA: Montel Clock, CRNA Performed: resident/CRNA  Preanesthetic Checklist Completed: patient identified, site marked, surgical consent, pre-op evaluation, timeout performed, IV checked, risks and benefits discussed and monitors and equipment checked Spinal Block Patient position: sitting Prep: DuraPrep Patient monitoring: heart rate, cardiac monitor, continuous pulse ox and blood pressure Approach: midline Location: L3-4 Injection technique: single-shot Needle Needle type: Sprotte and Pencan  Needle gauge: 24 G Needle length: 10 cm Needle insertion depth: 7.5 cm Assessment Sensory level: T6

## 2019-04-23 NOTE — Interval H&P Note (Signed)
History and Physical Interval Note:  04/23/2019 9:00 AM  Jordan Chaney  has presented today for surgery, with the diagnosis of LEFT  KNEE OSTEOARTHRITIS.  The various methods of treatment have been discussed with the patient and family. After consideration of risks, benefits and other options for treatment, the patient has consented to  Procedure(s): LEFT Knee Arthroplasty (Left) as a surgical intervention.  The patient's history has been reviewed, patient examined, no change in status, stable for surgery.  I have reviewed the patient's chart and labs.  Questions were answered to the patient's satisfaction.     Kerin Salen

## 2019-04-23 NOTE — Anesthesia Procedure Notes (Signed)
Anesthesia Regional Block: Adductor canal block   Pre-Anesthetic Checklist: ,, timeout performed, Correct Patient, Correct Site, Correct Laterality, Correct Procedure, Correct Position, site marked, Risks and benefits discussed,  Surgical consent,  Pre-op evaluation,  At surgeon's request and post-op pain management  Laterality: Left  Prep: chloraprep       Needles:  Injection technique: Single-shot  Needle Type: Echogenic Stimulator Needle     Needle Length: 9cm  Needle Gauge: 21   Needle insertion depth: 7 cm   Additional Needles:   Procedures:,,,, ultrasound used (permanent image in chart),,,,  Narrative:  Start time: 04/23/2019 9:53 AM End time: 04/23/2019 9:57 AM Injection made incrementally with aspirations every 5 mL.  Performed by: Personally  Anesthesiologist: Josephine Igo, MD  Additional Notes: Timeout performed. Patient sedated. Relevant anatomy ID'd using [place]. Incremental 2-31ml injection of LA with frequent aspiration. Patient tolerated procedure well.        Left Adductor Canal Block

## 2019-04-23 NOTE — Anesthesia Postprocedure Evaluation (Signed)
Anesthesia Post Note  Patient: Jordan Chaney  Procedure(s) Performed: LEFT Knee Arthroplasty (Left Knee)     Patient location during evaluation: PACU Anesthesia Type: Spinal Level of consciousness: awake and alert and oriented Pain management: pain level controlled Vital Signs Assessment: post-procedure vital signs reviewed and stable Respiratory status: spontaneous breathing, nonlabored ventilation and respiratory function stable Cardiovascular status: blood pressure returned to baseline and stable Postop Assessment: no apparent nausea or vomiting, spinal receding and patient able to bend at knees Anesthetic complications: no    Last Vitals:  Vitals:   04/23/19 1500 04/23/19 1515  BP: 112/68 115/69  Pulse: 64 62  Resp: 16 16  Temp:  (!) 36.4 C  SpO2: 97% 99%    Last Pain:  Vitals:   04/23/19 1515  TempSrc:   PainSc: 0-No pain                 Yuritza Paulhus A.

## 2019-04-24 ENCOUNTER — Encounter (HOSPITAL_COMMUNITY): Payer: Self-pay | Admitting: Orthopedic Surgery

## 2019-04-24 DIAGNOSIS — M170 Bilateral primary osteoarthritis of knee: Secondary | ICD-10-CM | POA: Diagnosis not present

## 2019-04-24 LAB — CBC
HCT: 38 % — ABNORMAL LOW (ref 39.0–52.0)
Hemoglobin: 12.5 g/dL — ABNORMAL LOW (ref 13.0–17.0)
MCH: 32.1 pg (ref 26.0–34.0)
MCHC: 32.9 g/dL (ref 30.0–36.0)
MCV: 97.7 fL (ref 80.0–100.0)
Platelets: 188 10*3/uL (ref 150–400)
RBC: 3.89 MIL/uL — ABNORMAL LOW (ref 4.22–5.81)
RDW: 12 % (ref 11.5–15.5)
WBC: 15 10*3/uL — ABNORMAL HIGH (ref 4.0–10.5)
nRBC: 0 % (ref 0.0–0.2)

## 2019-04-24 LAB — BASIC METABOLIC PANEL
Anion gap: 8 (ref 5–15)
BUN: 16 mg/dL (ref 8–23)
CO2: 24 mmol/L (ref 22–32)
Calcium: 8.7 mg/dL — ABNORMAL LOW (ref 8.9–10.3)
Chloride: 99 mmol/L (ref 98–111)
Creatinine, Ser: 0.82 mg/dL (ref 0.61–1.24)
GFR calc Af Amer: >60 mL/min (ref >60)
GFR calc non Af Amer: >60 mL/min (ref >60)
Glucose, Bld: 214 mg/dL — ABNORMAL HIGH (ref 70–99)
Potassium: 3.9 mmol/L (ref 3.5–5.1)
Sodium: 131 mmol/L — ABNORMAL LOW (ref 135–145)

## 2019-04-24 LAB — GLUCOSE, CAPILLARY: Glucose-Capillary: 193 mg/dL — ABNORMAL HIGH (ref 70–99)

## 2019-04-24 LAB — HEMOGLOBIN A1C
Hgb A1c MFr Bld: 6 % — ABNORMAL HIGH (ref 4.8–5.6)
Mean Plasma Glucose: 125.5 mg/dL

## 2019-04-24 NOTE — Progress Notes (Signed)
PATIENT ID: Jordan Chaney  MRN: [id number]  DOB/AGE:  68-Sep-1952 / 68 y.o.  1 Day Post-Op Procedure(s) (LRB): LEFT Knee Arthroplasty (Left)    PROGRESS NOTE Subjective: Patient is alert, oriented, no Nausea, no Vomiting, yes passing gas. Taking PO well. Denies SOB, Chest or Calf Pain. Using Incentive Spirometer, PAS in place. Ambulate 45', Patient reports pain as 3/10 .    Objective: Vital signs in last 24 hours: Vitals:   04/23/19 1844 04/23/19 2045 04/24/19 0051 04/24/19 0509  BP: 118/71 (Abnormal) 149/75 137/76 (Abnormal) 145/79  Pulse: 66 67 66 62  Resp: 16 16 16 16   Temp: (Abnormal) 97.5 F (36.4 C) 98.2 F (36.8 C) 97.7 F (36.5 C) 97.8 F (36.6 C)  TempSrc: Oral Oral Oral Oral  SpO2: 94% 96% 98% 99%  Weight:      Height:          Intake/Output from previous day: I/O last 3 completed shifts: In: 5062.6 [P.O.:1680; I.V.:3382.6] Out: 4725 [Urine:4575; Blood:150]   Intake/Output this shift: No intake/output data recorded.   LABORATORY DATA: Recent Labs    04/23/19 1402 04/23/19 2047 04/24/19 0239 04/24/19 0719  WBC  --   --  15.0*  --   HGB  --   --  12.5*  --   HCT  --   --  38.0*  --   PLT  --   --  188  --   NA  --   --  131*  --   K  --   --  3.9  --   CL  --   --  99  --   CO2  --   --  24  --   BUN  --   --  16  --   CREATININE  --   --  0.82  --   GLUCOSE  --   --  214*  --   GLUCAP 158* 265*  --  193*  CALCIUM  --   --  8.7*  --     Examination: Neurologically intact ABD soft Neurovascular intact Sensation intact distally Intact pulses distally Dorsiflexion/Plantar flexion intact Incision: dressing C/D/I No cellulitis present Compartment soft}  Assessment:   1 Day Post-Op Procedure(s) (LRB): LEFT Knee Arthroplasty (Left) ADDITIONAL DIAGNOSIS: Expected Acute Blood Loss Anemia, Diabetes  Patient's anticipated LOS is less than 2 midnights, meeting these requirements: - Younger than 96 - Lives within 1 hour of care - Has a competent  adult at home to recover with post-op recover - NO history of  - Chronic pain requiring opiods  - Diabetes  - Coronary Artery Disease  - Heart failure  - Heart attack  - Stroke  - DVT/VTE  - Cardiac arrhythmia  - Respiratory Failure/COPD  - Renal failure  - Anemia  - Advanced Liver disease       Plan: PT/OT WBAT, AROM and PROM  DVT Prophylaxis:  SCDx72hrs, ASA 81 mg BID x 2 weeks DISCHARGE PLAN: Home, today if passes PT DISCHARGE NEEDS: HHPT, Walker and 3-in-1 comode seat     Kerin Salen 04/24/2019, 7:30 AM Patient ID: Jordan Chaney, male   DOB: 1951/04/17, 68 y.o.   MRN: [id number]
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
## 2019-04-24 NOTE — Progress Notes (Signed)
Reviewed discharge instructions and prescriptions. Patient verbalizes understanding and has no further questions at this time. Patient waiting on son for arrival. Will continue to monitor.
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
## 2021-06-18 ENCOUNTER — Other Ambulatory Visit: Payer: Self-pay | Admitting: Orthopedic Surgery

## 2021-06-18 DIAGNOSIS — M25511 Pain in right shoulder: Secondary | ICD-10-CM

## 2021-07-19 ENCOUNTER — Ambulatory Visit
Admission: RE | Admit: 2021-07-19 | Discharge: 2021-07-19 | Disposition: A | Discharge status: Home / Self Care | Payer: Medicare Other | Source: Ambulatory Visit | Attending: Orthopedic Surgery | Admitting: Orthopedic Surgery

## 2021-07-19 ENCOUNTER — Other Ambulatory Visit: Payer: Self-pay

## 2021-07-19 DIAGNOSIS — M25511 Pain in right shoulder: Secondary | ICD-10-CM

## 2021-08-11 ENCOUNTER — Other Ambulatory Visit: Payer: Self-pay

## 2021-08-11 ENCOUNTER — Ambulatory Visit
Admission: RE | Admit: 2021-08-11 | Discharge: 2021-08-11 | Disposition: A | Discharge status: Home / Self Care | Payer: Medicare Other | Source: Ambulatory Visit | Attending: Orthopedic Surgery | Admitting: Orthopedic Surgery

## 2021-08-28 ENCOUNTER — Other Ambulatory Visit: Payer: Self-pay

## 2021-08-28 ENCOUNTER — Other Ambulatory Visit: Payer: Self-pay | Admitting: Orthopedic Surgery

## 2021-08-28 ENCOUNTER — Encounter (HOSPITAL_COMMUNITY): Payer: Self-pay | Admitting: Orthopedic Surgery

## 2021-08-28 NOTE — Progress Notes (Signed)
Spoke with pt for pre-op call. Pt has hx of CAD with CABG in 2014. Pt's cardiologist is Dr. Hanley Hays at [facility]. Pt is very concerned as to whether Dr. Veda Canning office has gotten a cardiac clearance yet. I told him I will call Monday AM and find out. I will be calling Dr. Dyke Brackett office for recent EKG and cardiac tests. Pt denies any recent chest pain or shortness of breath. Pt was instructed to hold his 81 mg Aspirin, last dose was 08/27/21.  Pt is a type 2 diabetic. States his last A1C was 6.9 in October, 2022. He states his fasting blood sugar is usually between 130-145. Pt will not take his Glipizide or Metformin the day of surgery. Instructed pt to check his blood sugar when he wakes up Tuesday AM and every 2 hours until he leaves for the hospital. If blood sugar is 70 or below, treat with 1/2 cup of clear juice (apple or cranberry) and recheck blood sugar 15 minutes after drinking juice. If blood sugar continues to be 70 or below, call the Short Stay department and ask to speak to a nurse. Pt voiced understanding.   Pt's surgery is scheduled as ambulatory so no Covid test is required prior to surgery.

## 2021-09-01 ENCOUNTER — Other Ambulatory Visit: Payer: Self-pay

## 2021-09-01 ENCOUNTER — Ambulatory Visit (HOSPITAL_COMMUNITY): Payer: Medicare Other | Admitting: Anesthesiology

## 2021-09-01 ENCOUNTER — Encounter (HOSPITAL_COMMUNITY)
Admission: RE | Disposition: A | Discharge status: Home / Self Care | Payer: Self-pay | Source: Home / Self Care | Attending: Orthopedic Surgery

## 2021-09-01 ENCOUNTER — Ambulatory Visit (HOSPITAL_COMMUNITY)
Admission: RE | Admit: 2021-09-01 | Discharge: 2021-09-01 | Disposition: A | Discharge status: Home / Self Care | Payer: Medicare Other | Attending: Orthopedic Surgery | Admitting: Orthopedic Surgery

## 2021-09-01 ENCOUNTER — Encounter (HOSPITAL_COMMUNITY): Payer: Self-pay | Admitting: Orthopedic Surgery

## 2021-09-01 DIAGNOSIS — I10 Essential (primary) hypertension: Secondary | ICD-10-CM | POA: Diagnosis not present

## 2021-09-01 DIAGNOSIS — Z7984 Long term (current) use of oral hypoglycemic drugs: Secondary | ICD-10-CM | POA: Insufficient documentation

## 2021-09-01 DIAGNOSIS — M1990 Unspecified osteoarthritis, unspecified site: Secondary | ICD-10-CM | POA: Insufficient documentation

## 2021-09-01 DIAGNOSIS — Z791 Long term (current) use of non-steroidal anti-inflammatories (NSAID): Secondary | ICD-10-CM | POA: Insufficient documentation

## 2021-09-01 DIAGNOSIS — Z951 Presence of aortocoronary bypass graft: Secondary | ICD-10-CM | POA: Insufficient documentation

## 2021-09-01 DIAGNOSIS — Z79899 Other long term (current) drug therapy: Secondary | ICD-10-CM | POA: Diagnosis not present

## 2021-09-01 DIAGNOSIS — M75101 Unspecified rotator cuff tear or rupture of right shoulder, not specified as traumatic: Secondary | ICD-10-CM | POA: Insufficient documentation

## 2021-09-01 DIAGNOSIS — M109 Gout, unspecified: Secondary | ICD-10-CM | POA: Insufficient documentation

## 2021-09-01 DIAGNOSIS — K219 Gastro-esophageal reflux disease without esophagitis: Secondary | ICD-10-CM | POA: Insufficient documentation

## 2021-09-01 DIAGNOSIS — I2510 Atherosclerotic heart disease of native coronary artery without angina pectoris: Secondary | ICD-10-CM | POA: Diagnosis not present

## 2021-09-01 DIAGNOSIS — E119 Type 2 diabetes mellitus without complications: Secondary | ICD-10-CM | POA: Diagnosis not present

## 2021-09-01 DIAGNOSIS — Z87891 Personal history of nicotine dependence: Secondary | ICD-10-CM | POA: Diagnosis not present

## 2021-09-01 DIAGNOSIS — E7800 Pure hypercholesterolemia, unspecified: Secondary | ICD-10-CM | POA: Insufficient documentation

## 2021-09-01 HISTORY — PX: SHOULDER ARTHROSCOPY WITH ROTATOR CUFF REPAIR AND SUBACROMIAL DECOMPRESSION: SHX5686

## 2021-09-01 LAB — BASIC METABOLIC PANEL
Anion gap: 8 (ref 5–15)
BUN: 14 mg/dL (ref 8–23)
CO2: 25 mmol/L (ref 22–32)
Calcium: 9.4 mg/dL (ref 8.9–10.3)
Chloride: 102 mmol/L (ref 98–111)
Creatinine, Ser: 0.84 mg/dL (ref 0.61–1.24)
GFR, Estimated: >60 mL/min (ref >60)
Glucose, Bld: 146 mg/dL — ABNORMAL HIGH (ref 70–99)
Potassium: 3.4 mmol/L — ABNORMAL LOW (ref 3.5–5.1)
Sodium: 135 mmol/L (ref 135–145)

## 2021-09-01 LAB — CBC
HCT: 44.5 % (ref 39.0–52.0)
Hemoglobin: 14.6 g/dL (ref 13.0–17.0)
MCH: 30.9 pg (ref 26.0–34.0)
MCHC: 32.8 g/dL (ref 30.0–36.0)
MCV: 94.3 fL (ref 80.0–100.0)
Platelets: 218 10*3/uL (ref 150–400)
RBC: 4.72 MIL/uL (ref 4.22–5.81)
RDW: 12.4 % (ref 11.5–15.5)
WBC: 6.6 10*3/uL (ref 4.0–10.5)
nRBC: 0 % (ref 0.0–0.2)

## 2021-09-01 LAB — GLUCOSE, CAPILLARY
Glucose-Capillary: 146 mg/dL — ABNORMAL HIGH (ref 70–99)
Glucose-Capillary: 123 mg/dL — ABNORMAL HIGH (ref 70–99)
Glucose-Capillary: 109 mg/dL — ABNORMAL HIGH (ref 70–99)

## 2021-09-01 SURGERY — SHOULDER ARTHROSCOPY WITH ROTATOR CUFF REPAIR AND SUBACROMIAL DECOMPRESSION
Anesthesia: General | Laterality: Right

## 2021-09-01 MED ORDER — MIDAZOLAM HCL 2 MG/2ML IJ SOLN: 1.0000 mg | Freq: Once | INTRAMUSCULAR | Status: AC

## 2021-09-01 MED ORDER — MIDAZOLAM HCL 2 MG/2ML IJ SOLN
INTRAMUSCULAR | Status: AC
  Administered 2021-09-01: 13:00:00 1 mg via INTRAVENOUS
  Filled 2021-09-01: qty 2

## 2021-09-01 MED ORDER — SUGAMMADEX SODIUM 500 MG/5ML IV SOLN
INTRAVENOUS | Status: DC | PRN
  Administered 2021-09-01: 500 mg via INTRAVENOUS

## 2021-09-01 MED ORDER — LACTATED RINGERS IV SOLN: INTRAVENOUS | Status: DC | PRN

## 2021-09-01 MED ORDER — PHENYLEPHRINE HCL-NACL 20-0.9 MG/250ML-% IV SOLN
INTRAVENOUS | Status: DC | PRN
  Administered 2021-09-01: 100 ug/min via INTRAVENOUS

## 2021-09-01 MED ORDER — TIZANIDINE HCL 2 MG PO TABS: 2.0000 mg | ORAL_TABLET | Freq: Three times a day (TID) | ORAL | 0 refills | Status: DC | PRN

## 2021-09-01 MED ORDER — CEFAZOLIN SODIUM-DEXTROSE 1-4 GM/50ML-% IV SOLN
INTRAVENOUS | Status: DC | PRN
  Administered 2021-09-01: 2 g via INTRAVENOUS

## 2021-09-01 MED ORDER — KETAMINE HCL 50 MG/5ML IJ SOSY
PREFILLED_SYRINGE | INTRAMUSCULAR | Status: AC
  Filled 2021-09-01: qty 5

## 2021-09-01 MED ORDER — SODIUM CHLORIDE 0.9 % IR SOLN
Status: DC | PRN
  Administered 2021-09-01 (×2): 3000 mL

## 2021-09-01 MED ORDER — PROPOFOL 10 MG/ML IV BOLUS
INTRAVENOUS | Status: AC
  Filled 2021-09-01: qty 20

## 2021-09-01 MED ORDER — LACTATED RINGERS IV SOLN: INTRAVENOUS | Status: DC

## 2021-09-01 MED ORDER — FENTANYL CITRATE (PF) 250 MCG/5ML IJ SOLN
INTRAMUSCULAR | Status: DC | PRN
  Administered 2021-09-01: 100 ug via INTRAVENOUS

## 2021-09-01 MED ORDER — EPHEDRINE SULFATE-NACL 50-0.9 MG/10ML-% IV SOSY
PREFILLED_SYRINGE | INTRAVENOUS | Status: DC | PRN
  Administered 2021-09-01: 15 mg via INTRAVENOUS
  Administered 2021-09-01: 10 mg via INTRAVENOUS

## 2021-09-01 MED ORDER — ROCURONIUM BROMIDE 100 MG/10ML IV SOLN
INTRAVENOUS | Status: DC | PRN
  Administered 2021-09-01: 110 mg via INTRAVENOUS

## 2021-09-01 MED ORDER — ORAL CARE MOUTH RINSE: 15.0000 mL | Freq: Once | OROMUCOSAL | Status: AC

## 2021-09-01 MED ORDER — LIDOCAINE 2% (20 MG/ML) 5 ML SYRINGE
INTRAMUSCULAR | Status: DC | PRN
  Administered 2021-09-01: 100 mg via INTRAVENOUS

## 2021-09-01 MED ORDER — BUPIVACAINE LIPOSOME 1.3 % IJ SUSP
INTRAMUSCULAR | Status: DC | PRN
  Administered 2021-09-01: 10 mL via PERINEURAL

## 2021-09-01 MED ORDER — OXYCODONE HCL 5 MG PO TABS: 5.0000 mg | ORAL_TABLET | Freq: Four times a day (QID) | ORAL | 0 refills | Status: DC | PRN

## 2021-09-01 MED ORDER — PHENYLEPHRINE 40 MCG/ML (10ML) SYRINGE FOR IV PUSH (FOR BLOOD PRESSURE SUPPORT)
PREFILLED_SYRINGE | INTRAVENOUS | Status: DC | PRN
  Administered 2021-09-01 (×3): 120 ug via INTRAVENOUS

## 2021-09-01 MED ORDER — FENTANYL CITRATE (PF) 250 MCG/5ML IJ SOLN
INTRAMUSCULAR | Status: AC
  Filled 2021-09-01: qty 5

## 2021-09-01 MED ORDER — PROPOFOL 10 MG/ML IV BOLUS
INTRAVENOUS | Status: DC | PRN
  Administered 2021-09-01: 130 mg via INTRAVENOUS

## 2021-09-01 MED ORDER — FENTANYL CITRATE (PF) 100 MCG/2ML IJ SOLN: 50.0000 ug | Freq: Once | INTRAMUSCULAR | Status: AC

## 2021-09-01 MED ORDER — BUPIVACAINE HCL (PF) 0.5 % IJ SOLN
INTRAMUSCULAR | Status: DC | PRN
  Administered 2021-09-01: 15 mL via PERINEURAL

## 2021-09-01 MED ORDER — ONDANSETRON HCL 4 MG/2ML IJ SOLN
INTRAMUSCULAR | Status: DC | PRN
  Administered 2021-09-01 (×2): 4 mg via INTRAVENOUS

## 2021-09-01 MED ORDER — FENTANYL CITRATE (PF) 100 MCG/2ML IJ SOLN
INTRAMUSCULAR | Status: AC
  Administered 2021-09-01: 13:00:00 50 ug via INTRAVENOUS
  Filled 2021-09-01: qty 2

## 2021-09-01 MED ORDER — CHLORHEXIDINE GLUCONATE 0.12 % MT SOLN
15.0000 mL | Freq: Once | OROMUCOSAL | Status: AC
  Administered 2021-09-01: 12:00:00 15 mL via OROMUCOSAL
  Filled 2021-09-01: qty 15

## 2021-09-01 MED ORDER — SUGAMMADEX SODIUM 500 MG/5ML IV SOLN
INTRAVENOUS | Status: AC
  Filled 2021-09-01: qty 10

## 2021-09-01 SURGICAL SUPPLY — 68 items
ANCHOR PEEK 4.75X19.1 SWLK C (Anchor) ×10 IMPLANT
BAG COUNTER SPONGE SURGICOUNT (BAG) ×2 IMPLANT
BAG SURGICOUNT SPONGE COUNTING (BAG) ×1
BLADE SURG 11 STRL SS (BLADE) ×3 IMPLANT
BURR OVAL 8 FLU 4.0MM X 13CM (MISCELLANEOUS) ×1
BURR OVAL 8 FLU 4.0X13 (MISCELLANEOUS) ×2 IMPLANT
CANNULA 5.75X71 LONG (CANNULA) ×3 IMPLANT
CANNULA TWIST IN 8.25X7CM (CANNULA) IMPLANT
CHLORAPREP W/TINT 26 (MISCELLANEOUS) ×3 IMPLANT
CUTTER BONE 4.0MM X 13CM (MISCELLANEOUS) ×3 IMPLANT
DRAPE INCISE IOBAN 66X45 STRL (DRAPES) ×3 IMPLANT
DRAPE ORTHO SPLIT 77X108 STRL (DRAPES) ×4
DRAPE STERI 35X30 U-POUCH (DRAPES) ×3 IMPLANT
DRAPE SURG 17X23 STRL (DRAPES) ×3 IMPLANT
DRAPE SURG ORHT 6 SPLT 77X108 (DRAPES) ×2 IMPLANT
DRAPE U-SHAPE 47X51 STRL (DRAPES) ×3 IMPLANT
DRAPE U-SHAPE 76X120 STRL (DRAPES) ×3 IMPLANT
DRSG PAD ABDOMINAL 8X10 ST (GAUZE/BANDAGES/DRESSINGS) ×6 IMPLANT
DRSG XEROFORM 1X8 (GAUZE/BANDAGES/DRESSINGS) ×2 IMPLANT
GAUZE SPONGE 4X4 12PLY STRL (GAUZE/BANDAGES/DRESSINGS) ×3 IMPLANT
GAUZE SPONGE 4X4 12PLY STRL LF (GAUZE/BANDAGES/DRESSINGS) ×2 IMPLANT
GAUZE XEROFORM 1X8 LF (GAUZE/BANDAGES/DRESSINGS) ×3 IMPLANT
GLOVE SRG 8 PF TXTR STRL LF DI (GLOVE) ×1 IMPLANT
GLOVE SURG ENC MOIS LTX SZ7 (GLOVE) ×6 IMPLANT
GLOVE SURG ENC MOIS LTX SZ7.5 (GLOVE) ×3 IMPLANT
GLOVE SURG UNDER POLY LF SZ7 (GLOVE) ×6 IMPLANT
GLOVE SURG UNDER POLY LF SZ8 (GLOVE) ×2
GOWN STRL REUS W/ TWL LRG LVL3 (GOWN DISPOSABLE) ×3 IMPLANT
GOWN STRL REUS W/ TWL XL LVL3 (GOWN DISPOSABLE) ×1 IMPLANT
GOWN STRL REUS W/TWL LRG LVL3 (GOWN DISPOSABLE) ×6
GOWN STRL REUS W/TWL XL LVL3 (GOWN DISPOSABLE) ×2
KIT BASIN OR (CUSTOM PROCEDURE TRAY) ×3 IMPLANT
KIT TURNOVER KIT B (KITS) ×3 IMPLANT
MANIFOLD NEPTUNE II (INSTRUMENTS) ×3 IMPLANT
NDL HYPO 25GX1X1/2 BEV (NEEDLE) IMPLANT
NDL SCORPION MULTI FIRE (NEEDLE) IMPLANT
NDL SPNL 18GX3.5 QUINCKE PK (NEEDLE) ×1 IMPLANT
NDL SUT 6 .5 CRC .975X.05 MAYO (NEEDLE) IMPLANT
NEEDLE HYPO 25GX1X1/2 BEV (NEEDLE) IMPLANT
NEEDLE MAYO TAPER (NEEDLE)
NEEDLE SCORPION MULTI FIRE (NEEDLE) ×3 IMPLANT
NEEDLE SPNL 18GX3.5 QUINCKE PK (NEEDLE) ×3 IMPLANT
PACK SHOULDER (CUSTOM PROCEDURE TRAY) ×3 IMPLANT
PAD ABD 8X10 STRL (GAUZE/BANDAGES/DRESSINGS) ×2 IMPLANT
PAD ARMBOARD 7.5X6 YLW CONV (MISCELLANEOUS) ×6 IMPLANT
PORT APPOLLO RF 90DEGREE MULTI (SURGICAL WAND) ×2 IMPLANT
PROBE BIPOLAR ATHRO 135MM 90D (MISCELLANEOUS) ×3 IMPLANT
SLING ARM FOAM STRAP LRG (SOFTGOODS) ×3 IMPLANT
SLING ARM FOAM STRAP MED (SOFTGOODS) IMPLANT
SPONGE T-LAP 4X18 ~~LOC~~+RFID (SPONGE) IMPLANT
SUPPORT WRAP ARM LG (MISCELLANEOUS) ×3 IMPLANT
SUT 2 FIBERLOOP 20 STRT BLUE (SUTURE)
SUT ETHILON 3 0 PS 1 (SUTURE) ×3 IMPLANT
SUT FIBERWIRE #2 38 T-5 BLUE (SUTURE) ×6
SUT TIGER TAPE 7 IN WHITE (SUTURE) IMPLANT
SUTURE 2 FIBERLOOP 20 STRT BLU (SUTURE) IMPLANT
SUTURE FIBERWR #2 38 T-5 BLUE (SUTURE) ×2 IMPLANT
SUTURE TAPE 1.3 40 TPR END (SUTURE) IMPLANT
SUTURETAPE 1.3 40 TPR END (SUTURE)
SYR CONTROL 10ML LL (SYRINGE) ×3 IMPLANT
TAPE CLOTH SURG 4X10 WHT LF (GAUZE/BANDAGES/DRESSINGS) ×2 IMPLANT
TAPE FIBER 2MM 7IN #2 BLUE (SUTURE) IMPLANT
TOWEL GREEN STERILE (TOWEL DISPOSABLE) ×3 IMPLANT
TOWEL OR NON WOVEN STRL DISP B (DISPOSABLE) ×3 IMPLANT
TUBE CONNECTING 12'X1/4 (SUCTIONS) ×1
TUBE CONNECTING 12X1/4 (SUCTIONS) ×2 IMPLANT
TUBING ARTHROSCOPY IRRIG 16FT (MISCELLANEOUS) ×3 IMPLANT
WATER STERILE IRR 1000ML POUR (IV SOLUTION) ×3 IMPLANT

## 2021-09-01 NOTE — Anesthesia Procedure Notes (Signed)
Procedure Name: Intubation Date/Time: 09/01/2021 2:15 PM Performed by: Ervin Knack, CRNA Pre-anesthesia Checklist: Patient identified, Emergency Drugs available, Suction available and Patient being monitored Patient Re-evaluated:Patient Re-evaluated prior to induction Oxygen Delivery Method: Circle system utilized Preoxygenation: Pre-oxygenation with 100% oxygen Induction Type: IV induction Ventilation: Mask ventilation without difficulty Laryngoscope Size: Glidescope and 4 Grade View: Grade I Tube type: Oral Tube size: 7.5 mm Number of attempts: 1 Airway Equipment and Method: Stylet Placement Confirmation: ETT inserted through vocal cords under direct vision, positive ETCO2 and breath sounds checked- equal and bilateral Tube secured with: Tape Dental Injury: Teeth and Oropharynx as per pre-operative assessment

## 2021-09-01 NOTE — Anesthesia Procedure Notes (Signed)
Anesthesia Regional Block: Interscalene brachial plexus block   Pre-Anesthetic Checklist: , timeout performed,  Correct Patient, Correct Site, Correct Laterality,  Correct Procedure, Correct Position, site marked,  Risks and benefits discussed,  Surgical consent,  Pre-op evaluation,  At surgeon's request and post-op pain management  Laterality: Right  Prep: chloraprep       Needles:  Injection technique: Single-shot  Needle Type: Echogenic Stimulator Needle     Needle Length: 9cm  Needle Gauge: 22     Additional Needles:   Procedures:,,,, ultrasound used (permanent image in chart),,    Narrative:  Start time: 09/01/2021 12:43 PM End time: 09/01/2021 12:49 PM Injection made incrementally with aspirations every 5 mL.  Performed by: Personally  Anesthesiologist: Cecile Hearing, MD  Additional Notes: Functioning IV was confirmed and monitors were applied.  A 52mm 22ga Arrow echogenic stimulator needle was used. Sterile prep and drape, hand hygiene, and sterile gloves were used.  Negative aspiration and negative test dose prior to incremental administration of local anesthetic. The patient tolerated the procedure well.  Ultrasound guidance: relevent anatomy identified, needle position confirmed, local anesthetic spread visualized around nerve(s), vascular puncture avoided.  Image printed for medical record.
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
## 2021-09-01 NOTE — Anesthesia Postprocedure Evaluation (Signed)
Anesthesia Post Note  Patient: Jordan Chaney  Procedure(s) Performed: SHOULDER ARTHROSCOPY WITH ROTATOR CUFF REPAIR AND SUBACROMIAL DECOMPRESSION (Right)     Patient location during evaluation: PACU Anesthesia Type: General and Regional Level of consciousness: awake and alert Pain management: pain level controlled Vital Signs Assessment: post-procedure vital signs reviewed and stable Respiratory status: spontaneous breathing, nonlabored ventilation and respiratory function stable Cardiovascular status: blood pressure returned to baseline and stable Postop Assessment: no apparent nausea or vomiting Anesthetic complications: no   No notable events documented.  Last Vitals:  Vitals:   09/01/21 1615 09/01/21 1630  BP: (!) 93/52 123/61  Pulse: 72 76  Resp: 20 18  Temp:  36.6 C  SpO2: 92% 92%    Last Pain:  Vitals:   09/01/21 1615  PainSc: 0-No pain                 Natara Monfort,W. EDMOND

## 2021-09-01 NOTE — Op Note (Signed)
Procedure(s): SHOULDER ARTHROSCOPY WITH ROTATOR CUFF REPAIR AND SUBACROMIAL DECOMPRESSION Procedure Note  Gillian Kluever male 70 y.o. 09/01/2021   Preoperative diagnosis: Right shoulder large rotator cuff tear with unfavorable acromial anatomy  Procedure(s) and Anesthesia Type:    * SHOULDER ARTHROSCOPY WITH ROTATOR CUFF REPAIR AND SUBACROMIAL DECOMPRESSION - Choice  Surgeon(s) and Role:    Jones Broom, MD - Primary     Surgeon: Glennon Hamilton   Assistants: Fredia Sorrow PA-C Amber was present and scrubbed throughout the procedure and was essential in positioning, assisting with the camera and instrumentation,, and closure)  Anesthesia: General endotracheal anesthesia with preoperative interscalene block given by the attending anesthesiologist    Procedure Detail  SHOULDER ARTHROSCOPY WITH ROTATOR CUFF REPAIR AND SUBACROMIAL DECOMPRESSION  Estimated Blood Loss: Min         Drains: none  Blood Given: none         Specimens: none        Complications:  * No complications entered in OR log *         Disposition: PACU - hemodynamically stable.         Condition: stable    Procedure:   INDICATIONS FOR SURGERY: The patient is 70 y.o. male who fell and has a large rotator cuff tear.  He has failed conservative management and indicated for surgical treatment to restore function and decrease pain.  OPERATIVE FINDINGS: Examination under anesthesia: No significant stiffness or instability   DESCRIPTION OF PROCEDURE: The patient was identified in preoperative  holding area where I personally marked the operative site after  verifying site, side, and procedure with the patient. An interscalene block was given by the attending anesthesiologist the holding area.  The patient was taken back to the operating room where general anesthesia was induced without complication and was placed in the beach-chair position with the back  elevated about 60 degrees and all  extremities and head and neck carefully padded and  positioned.   The right upper extremity was then prepped and  draped in a standard sterile fashion. The appropriate time-out  procedure was carried out. The patient did receive IV antibiotics  within 30 minutes of incision.   A small posterior portal incision was made and the arthroscope was introduced into the joint. An anterior portal was then established above the subscapularis using needle localization. Small cannula was placed anteriorly. Diagnostic arthroscopy was then carried out .  The biceps tendon was noted to have greater than 50% partial tearing along with some degenerative tearing of the superior labrum.  This was debrided back to healthy labrum.  The biceps tendon was released from the superior labrum and allowed to retract from the joint.  The anterior posterior labrum also had some degenerative tearing which was debrided back to healthy stable labrum.  The joint surfaces were intact without significant chondromalacia.  The subscapularis had some mild degenerative tearing which was debrided back.  The supraspinatus and infraspinatus were completely torn with moderate retraction.  The arthroscope was then introduced into the subacromial space a standard lateral portal was established with needle localization. The shaver was used through the lateral portal to perform extensive bursectomy.  The tear edge was identified and debrided back to healthy-appearing tendon.  The tendon was felt to be good quality and was easily reduced back over the tuberosity.  The tuberosity was prepared down to a bleeding bony surface with the bur and the repair was then carried out by placing 3 4.75  peek swivel lock anchors just off the articular margin each preloaded with fiber tape and Tiger tape.  These 6 suture strands were then passed evenly throughout the tear and brought over to 2 additional peek swivel lock anchors in a lateral row bringing the tendon  down nicely over the tuberosity.  The repair was felt to be smooth and watertight.  The coracoacromial ligament was taken down off the anterior acromion with the ArthroCare exposing a large hooked anterior acromial spur. A high-speed bur was then used through the lateral portal to take down the anterior acromial spur from lateral to medial in a standard acromioplasty.  The acromioplasty was also viewed from the lateral portal and the bur was used as necessary to ensure that the acromion was completely flat from posterior to anterior.  The arthroscopic equipment was removed from the joint and the portals were closed with 3-0 nylon in an interrupted fashion. Sterile dressings were then applied including Xeroform 4 x 4's ABDs and tape. The patient was then allowed to awaken from general anesthesia, placed in a sling, transferred to the stretcher and taken to the recovery room in stable condition.   POSTOPERATIVE PLAN: The patient will be discharged home today and will followup in one week for suture removal and wound check.

## 2021-09-01 NOTE — Transfer of Care (Signed)
Immediate Anesthesia Transfer of Care Note  Patient: Jordan Chaney  Procedure(s) Performed: SHOULDER ARTHROSCOPY WITH ROTATOR CUFF REPAIR AND SUBACROMIAL DECOMPRESSION (Right)  Patient Location: PACU  Anesthesia Type:General  Level of Consciousness: drowsy and patient cooperative  Airway & Oxygen Therapy: Patient Spontanous Breathing  Post-op Assessment: Report given to RN and Post -op Vital signs reviewed and stable  Post vital signs: Reviewed and stable  Last Vitals:  Vitals Value Taken Time  BP 116/76 09/01/21 1543  Temp    Pulse 80 09/01/21 1545  Resp 14 09/01/21 1545  SpO2 89 % 09/01/21 1545  Vitals shown include unvalidated device data.  Last Pain:  Vitals:   09/01/21 1124  PainSc: 0-No pain         Complications: No notable events documented.

## 2021-09-01 NOTE — Discharge Instructions (Signed)
Discharge Instructions after Reverse Total Shoulder Arthroplasty   . A sling has been provided for you. You are to wear this at all times (except for bathing and dressing), until your first post operative visit with Dr. Chandler. Please also wear while sleeping at night. While you bath and dress, let the arm/elbow extend straight down to stretch your elbow. Wiggle your fingers and pump your first while your in the sling to prevent hand swelling. . Use ice on the shoulder intermittently over the first 48 hours after surgery. Continue to use ice or and ice machine as needed after 48 hours for pain control/swelling.  . Pain medicine has been prescribed for you.  . Use your medicine liberally over the first 48 hours, and then you can begin to taper your use. You may take Extra Strength Tylenol or Tylenol only in place of the pain pills. DO NOT take ANY nonsteroidal anti-inflammatory pain medications: Advil, Motrin, Ibuprofen, Aleve, Naproxen or Naprosyn.  . Take one aspirin a day for 2 weeks after surgery, unless you have an aspirin sensitivity/allergy or asthma.  . Leave your dressing on until your first follow up visit.  You may shower with the dressing.  Hold your arm as if you still have your sling on while you shower. . Simply allow the water to wash over the site and then pat dry. Make sure your axilla (armpit) is completely dry after showering.    Please call [phone] during normal business hours or [phone] after hours for any problems. Including the following:  - excessive redness of the incisions - drainage for more than 4 days - fever of more than 101.5 F  *Please note that pain medications will not be refilled after hours or on weekends.     

## 2021-09-01 NOTE — Anesthesia Preprocedure Evaluation (Addendum)
Anesthesia Evaluation  Patient identified by MRN, date of birth, ID band Patient awake    Reviewed: Allergy & Precautions, NPO status , Patient's Chart, lab work & pertinent test results, reviewed documented beta blocker date and time   History of Anesthesia Complications (+) history of anesthetic complications (VC damage during carotid surgery)  Airway Mallampati: III  TM Distance: >3 FB Neck ROM: Full    Dental  (+) Missing   Pulmonary former smoker,    Pulmonary exam normal breath sounds clear to auscultation       Cardiovascular hypertension, Pt. on medications and Pt. on home beta blockers (-) angina+ CAD and + CABG (2014)  + Valvular Problems/Murmurs AS  Rhythm:Regular Rate:Normal + Systolic murmurs    Neuro/Psych negative neurological ROS  negative psych ROS   GI/Hepatic Neg liver ROS, hiatal hernia, GERD  Medicated,  Endo/Other  diabetes, Type 2, Oral Hypoglycemic AgentsObesity   Renal/GU negative Renal ROS     Musculoskeletal  (+) Arthritis , Right shoulder rotator cuff tear   Abdominal   Peds  Hematology negative hematology ROS (+)   Anesthesia Other Findings Day of surgery medications reviewed with the patient.  Reproductive/Obstetrics                            Anesthesia Physical Anesthesia Plan  ASA: 3  Anesthesia Plan: General   Post-op Pain Management: Regional block and Tylenol PO (pre-op)   Induction: Intravenous  PONV Risk Score and Plan: 2 and Midazolam, Dexamethasone and Ondansetron  Airway Management Planned: Oral ETT and Video Laryngoscope Planned  Additional Equipment:   Intra-op Plan:   Post-operative Plan: Extubation in OR  Informed Consent: I have reviewed the patients History and Physical, chart, labs and discussed the procedure including the risks, benefits and alternatives for the proposed anesthesia with the patient or authorized representative  who has indicated his/her understanding and acceptance.     Dental advisory given  Plan Discussed with: CRNA  Anesthesia Plan Comments: (CLEAR SIGHT for BP monitoring )        Anesthesia Quick Evaluation

## 2021-09-03 ENCOUNTER — Encounter (HOSPITAL_COMMUNITY): Payer: Self-pay | Admitting: Orthopedic Surgery

## 2022-06-29 ENCOUNTER — Ambulatory Visit: Payer: Medicare Other | Attending: Cardiovascular Disease | Admitting: Cardiovascular Disease

## 2022-06-29 ENCOUNTER — Encounter: Payer: Self-pay | Admitting: Cardiovascular Disease

## 2022-06-29 DIAGNOSIS — I350 Nonrheumatic aortic (valve) stenosis: Secondary | ICD-10-CM | POA: Diagnosis not present

## 2022-06-29 DIAGNOSIS — Z87891 Personal history of nicotine dependence: Secondary | ICD-10-CM | POA: Diagnosis not present

## 2022-06-29 DIAGNOSIS — I6523 Occlusion and stenosis of bilateral carotid arteries: Secondary | ICD-10-CM

## 2022-06-29 DIAGNOSIS — Z951 Presence of aortocoronary bypass graft: Secondary | ICD-10-CM | POA: Diagnosis present

## 2022-06-29 DIAGNOSIS — R0609 Other forms of dyspnea: Secondary | ICD-10-CM | POA: Diagnosis present

## 2022-06-29 DIAGNOSIS — E782 Mixed hyperlipidemia: Secondary | ICD-10-CM | POA: Diagnosis not present

## 2022-06-29 DIAGNOSIS — I10 Essential (primary) hypertension: Secondary | ICD-10-CM | POA: Diagnosis not present

## 2022-06-29 DIAGNOSIS — E785 Hyperlipidemia, unspecified: Secondary | ICD-10-CM | POA: Insufficient documentation

## 2022-06-29 NOTE — Assessment & Plan Note (Signed)
Jordan Chaney was referred by Dr. Claudie Leach for severe aortic stenosis.  He does complain of progressive dyspnea over the last several years.  2D echo performed 05/31/2022 revealed normal LV systolic function with a peak aortic gradient of 80 mmHg and a valve area calculated by 1.2 cm.  Does have a loud outflow tract murmur.  He will need right left heart cath to further define his anatomy and physiology.  He may be a candidate for TAVR.

## 2022-06-29 NOTE — Assessment & Plan Note (Signed)
History of left carotid endarterectomy performed by Dr. Bridgett Larsson 11/08/2017 followed by Dr. Claudie Leach.

## 2022-06-29 NOTE — H&P (View-Only) (Signed)
   06/29/2022 Jordan Chaney   08/25/1951  [id number]  Primary Physician Arvind, Moogali M, MD Primary Cardiologist: Luisfelipe Engelstad J Jordan Lacroix MD FACP, FACC, FAHA, FSCAI  HPI:  Jordan Chaney is a 71 y.o. mildly overweight married Caucasian male father of 2 sons, grandfather of 6 grandchildren who was referred by Dr. Rosario, his primary cardiologist, for cardiac catheterization because of increasing dyspnea and severe aortic stenosis.  He is retired from working in construction and being a carpenter.  His cardiac risk factors include 30 pack years of tobacco abuse having quit at age 50, treated hypertension, diabetes and hyperlipidemia.  His father did have bypass surgery at age 69.  The patient had CABG x4 at [place] [facility] 11 years ago by Dr. Mills.  His presenting symptoms were progressive dyspnea as well.  He had uncomplicated right rotator cuff surgery in [place] last Christmas.  Over the last 4 to 5 years he has noted increasing dyspnea on exertion.  Recent Myoview performed by Dr. Rosario was nonischemic but 2D echo did show normal LV systolic function with at least moderate to severe aortic stenosis.  The peak gradient was 80 mmHg with a valve area of 1.2 cm.  He did have elective left carotid endarterectomy by Dr. Chen in 2019.   Current Meds  Medication Sig   acetaminophen (TYLENOL) 500 MG tablet Take 500-1,000 mg by mouth every 6 (six) hours as needed (for pain.).   allopurinol (ZYLOPRIM) 100 MG tablet Take 100 mg by mouth every Monday, Wednesday, and Friday.   aspirin EC 81 MG tablet Take 81 mg by mouth in the morning. Swallow whole.   Aspirin-Salicylamide-Caffeine (BC FAST PAIN RELIEF) 650-195-33.3 MG PACK Take 1 packet by mouth daily as needed (pain).   Cholecalciferol (VITAMIN D3 PO) Take 2,000 Units by mouth in the morning.   Coenzyme Q10 (CO Q-10) 100 MG CAPS Take 100 mg by mouth in the morning.   folic acid (FOLVITE) 1 MG tablet Take 1 mg by mouth daily.    glipiZIDE (GLUCOTROL XL) 5 MG 24 hr tablet Take 5 mg by mouth daily with breakfast.   hydrochlorothiazide (HYDRODIURIL) 25 MG tablet Take 25 mg by mouth in the morning.   lisinopril (PRINIVIL,ZESTRIL) 40 MG tablet Take 40 mg by mouth in the morning.   meloxicam (MOBIC) 15 MG tablet Take 15 mg by mouth in the morning.   metFORMIN (GLUCOPHAGE) 500 MG tablet Take 500 mg by mouth 2 (two) times daily with a meal.   metoprolol succinate (TOPROL-XL) 100 MG 24 hr tablet Take 100 mg by mouth daily.   omeprazole (PRILOSEC) 40 MG capsule Take 40 mg by mouth in the morning.   oxyCODONE (ROXICODONE) 5 MG immediate release tablet Take 1 tablet (5 mg total) by mouth every 6 (six) hours as needed.   Polyethyl Glycol-Propyl Glycol (LUBRICANT EYE DROPS) 0.4-0.3 % SOLN Place 1-2 drops into both eyes 3 (three) times daily as needed (pain).   POTASSIUM PO Take 1 tablet by mouth 2 (two) times a week.   rosuvastatin (CRESTOR) 20 MG tablet Take 20 mg by mouth every evening.    tiZANidine (ZANAFLEX) 2 MG tablet Take 1 tablet (2 mg total) by mouth every 8 (eight) hours as needed for muscle spasms.     No Known Allergies  Social History   Socioeconomic History   Marital status: Married    Spouse name: Not on file   Number of children: Not on file   Years   of education: Not on file   Highest education level: Not on file  Occupational History   Not on file  Tobacco Use   Smoking status: Former    Packs/day: 1.00    Types: Cigarettes    Quit date: 2001    Years since quitting: 22.8   Smokeless tobacco: Current    Types: Chew   Tobacco comments:     "Quit smoking cigarettes in 2002"  Vaping Use   Vaping Use: Never used  Substance and Sexual Activity   Alcohol use: Yes    Comment: " occasional glass of wine"   Drug use: No   Sexual activity: Not on file  Other Topics Concern   Not on file  Social History Narrative   Not on file   Social Determinants of Health   Financial Resource Strain: Low Risk   (11/09/2017)   Overall Financial Resource Strain (CARDIA)    Difficulty of Paying Living Expenses: Not very hard  Food Insecurity: Unknown (11/09/2017)   Hunger Vital Sign    Worried About Running Out of Food in the Last Year: Never true    Ran Out of Food in the Last Year: Not on file  Transportation Needs: Unknown (11/09/2017)   PRAPARE - Hydrologist (Medical): No    Lack of Transportation (Non-Medical): Not on file  Physical Activity: Insufficiently Active (11/09/2017)   Exercise Vital Sign    Days of Exercise per Week: 4 days    Minutes of Exercise per Session: 30 min  Stress: Not on file  Social Connections: Unknown (11/09/2017)   Social Connection and Isolation Panel [NHANES]    Frequency of Communication with Friends and Family: Patient refused    Frequency of Social Gatherings with Friends and Family: Patient refused    Attends Religious Services: Patient refused    Active Member of Clubs or Organizations: Patient refused    Attends Archivist Meetings: Patient refused    Marital Status: Patient refused  Intimate Partner Violence: Unknown (11/09/2017)   Humiliation, Afraid, Rape, and Kick questionnaire    Fear of Current or Ex-Partner: Patient refused    Emotionally Abused: Patient refused    Physically Abused: Patient refused    Sexually Abused: Patient refused     Review of Systems: General: negative for chills, fever, night sweats or weight changes.  Cardiovascular: negative for chest pain, dyspnea on exertion, edema, orthopnea, palpitations, paroxysmal nocturnal dyspnea or shortness of breath Dermatological: negative for rash Respiratory: negative for cough or wheezing Urologic: negative for hematuria Abdominal: negative for nausea, vomiting, diarrhea, bright red blood per rectum, melena, or hematemesis Neurologic: negative for visual changes, syncope, or dizziness All other systems reviewed and are otherwise negative except as  noted above.    Blood pressure (!) 136/90, pulse 64, weight 255 lb (115.7 kg), SpO2 97 %.  General appearance: alert and no distress Neck: no adenopathy, no JVD, supple, symmetrical, trachea midline, thyroid not enlarged, symmetric, no tenderness/mass/nodules, and soft bilateral carotid bruits versus transmitted murmur Lungs: clear to auscultation bilaterally Heart: 3/6 outflow tract murmur consistent with aortic stenosis Extremities: extremities normal, atraumatic, no cyanosis or edema Pulses: 2+ and symmetric Skin: Skin color, texture, turgor normal. No rashes or lesions Neurologic: Grossly normal  EKG sinus rhythm at 64 with nonspecific ST-T wave changes.  Personally reviewed this EKG.  ASSESSMENT AND PLAN:   Carotid artery disease ([place]) History of left carotid endarterectomy performed by Dr. Bridgett Larsson 11/08/2017 followed by Dr. Claudie Leach.  Essential hypertension History of essential hypertension with blood pressure measured today at 136/90.  He is on Toprol and lisinopril.  Hyperlipidemia History of hyperlipidemia on rosuvastatin followed by his PCP  History of tobacco abuse 30-pack-year history tobacco abuse having quit at age 50.  He does not have a diagnosis of COPD  Hx of CABG History of coronary artery bypass grafting x4 in [place] by Dr. Mills 11 years ago.  He denies chest pain but does complain of dyspnea on exertion which were similar to his symptoms prior to bypass surgery.  Severe aortic stenosis Jordan Chaney was referred by Dr. Rosario for severe aortic stenosis.  He does complain of progressive dyspnea over the last several years.  2D echo performed 05/31/2022 revealed normal LV systolic function with a peak aortic gradient of 80 mmHg and a valve area calculated by 1.2 cm.  Does have a loud outflow tract murmur.  He will need right left heart cath to further define his anatomy and physiology.  He may be a candidate for TAVR.     Mazelle Huebert J. Lateefa Crosby MD FACP,FACC,FAHA,  FSCAI 06/29/2022 9:22 AM 

## 2022-06-29 NOTE — Assessment & Plan Note (Signed)
30-pack-year history tobacco abuse having quit at age 71.  He does not have a diagnosis of COPD

## 2022-06-29 NOTE — Assessment & Plan Note (Signed)
History of essential hypertension with blood pressure measured today at 136/90.  He is on Toprol and lisinopril.

## 2022-06-29 NOTE — Assessment & Plan Note (Signed)
History of hyperlipidemia on rosuvastatin followed by his PCP 

## 2022-06-29 NOTE — Progress Notes (Signed)
06/29/2022 Jordan Chaney   28-Aug-1951  [id number]  Primary Physician Karle Plumber, MD Primary Cardiologist: Runell Gess MD Nicholes Calamity, [place]  HPI:  Jordan Chaney is a 71 y.o. mildly overweight married Caucasian male father of 2 sons, grandfather of 6 grandchildren who was referred by Dr. Hanley Hays, his primary cardiologist, for cardiac catheterization because of increasing dyspnea and severe aortic stenosis.  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  His cardiac risk factors include 30 pack years of tobacco abuse having quit at age 21, treated hypertension, diabetes and hyperlipidemia.  His father did have bypass surgery at age 57.  The patient had CABG x4 at [facility] hospital 11 years ago by Dr. Arvilla Market.  His presenting symptoms were progressive dyspnea as well.  He had uncomplicated right rotator cuff surgery in [place] last Christmas.  Over the last 4 to 5 years he has noted increasing dyspnea on exertion.  Recent Myoview performed by Dr. Hanley Hays was nonischemic but 2D echo did show normal LV systolic function with at least moderate to severe aortic stenosis.  The peak gradient was 80 mmHg with a valve area of 1.2 cm.  He did have elective left carotid endarterectomy by Dr. Imogene Burn in 2019.   Current Meds  Medication Sig   acetaminophen (TYLENOL) 500 MG tablet Take 500-1,000 mg by mouth every 6 (six) hours as needed (for pain.).   allopurinol (ZYLOPRIM) 100 MG tablet Take 100 mg by mouth every Monday, Wednesday, and Friday.   aspirin EC 81 MG tablet Take 81 mg by mouth in the morning. Swallow whole.   Aspirin-Salicylamide-Caffeine (BC FAST PAIN RELIEF) 650-195-33.3 MG PACK Take 1 packet by mouth daily as needed (pain).   Cholecalciferol (VITAMIN D3 PO) Take 2,000 Units by mouth in the morning.   Coenzyme Q10 (CO Q-10) 100 MG CAPS Take 100 mg by mouth in the morning.   folic acid (FOLVITE) 1 MG tablet Take 1 mg by mouth daily.    glipiZIDE (GLUCOTROL XL) 5 MG 24 hr tablet Take 5 mg by mouth daily with breakfast.   hydrochlorothiazide (HYDRODIURIL) 25 MG tablet Take 25 mg by mouth in the morning.   lisinopril (PRINIVIL,ZESTRIL) 40 MG tablet Take 40 mg by mouth in the morning.   meloxicam (MOBIC) 15 MG tablet Take 15 mg by mouth in the morning.   metFORMIN (GLUCOPHAGE) 500 MG tablet Take 500 mg by mouth 2 (two) times daily with a meal.   metoprolol succinate (TOPROL-XL) 100 MG 24 hr tablet Take 100 mg by mouth daily.   omeprazole (PRILOSEC) 40 MG capsule Take 40 mg by mouth in the morning.   oxyCODONE (ROXICODONE) 5 MG immediate release tablet Take 1 tablet (5 mg total) by mouth every 6 (six) hours as needed.   Polyethyl Glycol-Propyl Glycol (LUBRICANT EYE DROPS) 0.4-0.3 % SOLN Place 1-2 drops into both eyes 3 (three) times daily as needed (pain).   POTASSIUM PO Take 1 tablet by mouth 2 (two) times a week.   rosuvastatin (CRESTOR) 20 MG tablet Take 20 mg by mouth every evening.    tiZANidine (ZANAFLEX) 2 MG tablet Take 1 tablet (2 mg total) by mouth every 8 (eight) hours as needed for muscle spasms.     No Known Allergies  Social History   Socioeconomic History   Marital status: Married    Spouse name: Not on file   Number of children: Not on file   Years  of education: Not on file   Highest education level: Not on file  Occupational History   Not on file  Tobacco Use   Smoking status: Former    Packs/day: 1.00    Types: Cigarettes    Quit date: 2001    Years since quitting: 22.8   Smokeless tobacco: Current    Types: Chew   Tobacco comments:     "Quit smoking cigarettes in 2002"  Vaping Use   Vaping Use: Never used  Substance and Sexual Activity   Alcohol use: Yes    Comment: " occasional glass of wine"   Drug use: No   Sexual activity: Not on file  Other Topics Concern   Not on file  Social History Narrative   Not on file   Social Determinants of Health   Financial Resource Strain: Low Risk   (11/09/2017)   Overall Financial Resource Strain (CARDIA)    Difficulty of Paying Living Expenses: Not very hard  Food Insecurity: Unknown (11/09/2017)   Hunger Vital Sign    Worried About Running Out of Food in the Last Year: Never true    Ran Out of Food in the Last Year: Not on file  Transportation Needs: Unknown (11/09/2017)   PRAPARE - Hydrologist (Medical): No    Lack of Transportation (Non-Medical): Not on file  Physical Activity: Insufficiently Active (11/09/2017)   Exercise Vital Sign    Days of Exercise per Week: 4 days    Minutes of Exercise per Session: 30 min  Stress: Not on file  Social Connections: Unknown (11/09/2017)   Social Connection and Isolation Panel [NHANES]    Frequency of Communication with Friends and Family: Patient refused    Frequency of Social Gatherings with Friends and Family: Patient refused    Attends Religious Services: Patient refused    Active Member of Clubs or Organizations: Patient refused    Attends Archivist Meetings: Patient refused    Marital Status: Patient refused  Intimate Partner Violence: Unknown (11/09/2017)   Humiliation, Afraid, Rape, and Kick questionnaire    Fear of Current or Ex-Partner: Patient refused    Emotionally Abused: Patient refused    Physically Abused: Patient refused    Sexually Abused: Patient refused     Review of Systems: General: negative for chills, fever, night sweats or weight changes.  Cardiovascular: negative for chest pain, dyspnea on exertion, edema, orthopnea, palpitations, paroxysmal nocturnal dyspnea or shortness of breath Dermatological: negative for rash Respiratory: negative for cough or wheezing Urologic: negative for hematuria Abdominal: negative for nausea, vomiting, diarrhea, bright red blood per rectum, melena, or hematemesis Neurologic: negative for visual changes, syncope, or dizziness All other systems reviewed and are otherwise negative except as  noted above.    Blood pressure (!) 136/90, pulse 64, weight 255 lb (115.7 kg), SpO2 97 %.  General appearance: alert and no distress Neck: no adenopathy, no JVD, supple, symmetrical, trachea midline, thyroid not enlarged, symmetric, no tenderness/mass/nodules, and soft bilateral carotid bruits versus transmitted murmur Lungs: clear to auscultation bilaterally Heart: 3/6 outflow tract murmur consistent with aortic stenosis Extremities: extremities normal, atraumatic, no cyanosis or edema Pulses: 2+ and symmetric Skin: Skin color, texture, turgor normal. No rashes or lesions Neurologic: Grossly normal  EKG sinus rhythm at 64 with nonspecific ST-T wave changes.  Personally reviewed this EKG.  ASSESSMENT AND PLAN:   Carotid artery disease ([place]) History of left carotid endarterectomy performed by Dr. Bridgett Larsson 11/08/2017 followed by Dr. Claudie Leach.  Essential hypertension History of essential hypertension with blood pressure measured today at 136/90.  He is on Toprol and lisinopril.  Hyperlipidemia History of hyperlipidemia on rosuvastatin followed by his PCP  History of tobacco abuse 30-pack-year history tobacco abuse having quit at age 10.  He does not have a diagnosis of COPD  Hx of CABG History of coronary artery bypass grafting x4 in [place] by Dr. Arvilla Market 11 years ago.  He denies chest pain but does complain of dyspnea on exertion which were similar to his symptoms prior to bypass surgery.  Severe aortic stenosis Mr. Capasso was referred by Dr. Hanley Hays for severe aortic stenosis.  He does complain of progressive dyspnea over the last several years.  2D echo performed 05/31/2022 revealed normal LV systolic function with a peak aortic gradient of 80 mmHg and a valve area calculated by 1.2 cm.  Does have a loud outflow tract murmur.  He will need right left heart cath to further define his anatomy and physiology.  He may be a candidate for TAVR.     Runell Gess MD FACP,FACC,FAHA,  [facility] 06/29/2022 9:22 AM

## 2022-06-29 NOTE — Patient Instructions (Signed)
Medication Instructions:  Your physician recommends that you continue on your current medications as directed. Please refer to the Current Medication list given to you today.  *If you need a refill on your cardiac medications before your next appointment, please call your pharmacy*   Lab Work: Your physician recommends that you have labs drawn today: BMET & CBC   If you have labs (blood work) drawn today and your tests are completely normal, you will receive your results only by: MyChart Message (if you have MyChart) OR A paper copy in the mail If you have any lab test that is abnormal or we need to change your treatment, we will call you to review the results.   Testing/Procedures: See below   Follow-Up: At [facility], you and your health needs are our priority.  As part of our continuing mission to provide you with exceptional heart care, we have created designated Provider Care Teams.  These Care Teams include your primary Cardiologist (physician) and Advanced Practice Providers (APPs -  Physician Assistants and Nurse Practitioners) who all work together to provide you with the care you need, when you need it.  We recommend signing up for the patient portal called "MyChart".  Sign up information is provided on this After Visit Summary.  MyChart is used to connect with patients for Virtual Visits (Telemedicine).  Patients are able to view lab/test results, encounter notes, upcoming appointments, etc.  Non-urgent messages can be sent to your provider as well.   To learn more about what you can do with MyChart, go to [email or web address].    Your next appointment:   2 week(s) (after procedure 10/19)  The format for your next appointment:   In Person  Provider:   Nanetta Batty, MD   Other Instructions       Cardiac/Peripheral Catheterization   You are scheduled for a Cardiac Catheterization on Thursday, October 19 with Dr. Nanetta Batty.  1. Please arrive at  the Main Entrance A at [facility]: [address] [place], [place] [address] on October 19 at 9:30 AM (This time is two hours before your procedure to ensure your preparation). Free valet parking service is available. You will check in at ADMITTING. The support person will be asked to wait in the waiting room.  It is OK to have someone drop you off and come back when you are ready to be discharged.        Special note: Every effort is made to have your procedure done on time. Please understand that emergencies sometimes delay scheduled procedures.   . 2. Diet: Do not eat solid foods after midnight.  You may have clear liquids until 5 AM the day of the procedure.  3. Labs: You will need to have blood drawn today (10/17).    4. Medication instructions in preparation for your procedure:   Stop taking, HTCZ (Hydrochlorothiazide) Thursday, October 19,  Do not take Diabetes Med Glucophage (Metformin) and Glipizide (Glucotrol) on the day of the procedure and HOLD 48 HOURS AFTER THE PROCEDURE.  On the morning of your procedure, take Aspirin 81 mg and any morning medicines NOT listed above.  You may use sips of water.  5. Plan to go home the same day, you will only stay overnight if medically necessary. 6. You MUST have a responsible adult to drive you home. 7. An adult MUST be with you the first 24 hours after you arrive home. 8. Bring a current list of your medications,  and the last time and date medication taken. 9. Bring ID and current insurance cards. 10.Please wear clothes that are easy to get on and off and wear slip-on shoes.  Thank you for allowing [place] to care for you!   -- [facility]

## 2022-06-29 NOTE — Assessment & Plan Note (Signed)
History of coronary artery bypass grafting x4 in [place] by Dr. Jerelene Redden 11 years ago.  He denies chest pain but does complain of dyspnea on exertion which were similar to his symptoms prior to bypass surgery.

## 2022-06-30 ENCOUNTER — Other Ambulatory Visit: Payer: Self-pay

## 2022-06-30 ENCOUNTER — Telehealth: Payer: Self-pay | Admitting: *Deleted

## 2022-06-30 DIAGNOSIS — I350 Nonrheumatic aortic (valve) stenosis: Secondary | ICD-10-CM

## 2022-06-30 LAB — BASIC METABOLIC PANEL
BUN/Creatinine Ratio: 20 (ref 10–24)
BUN: 18 mg/dL (ref 8–27)
CO2: 23 mmol/L (ref 20–29)
Calcium: 9.8 mg/dL (ref 8.6–10.2)
Chloride: 98 mmol/L (ref 96–106)
Creatinine, Ser: 0.91 mg/dL (ref 0.76–1.27)
Glucose: 162 mg/dL — ABNORMAL HIGH (ref 70–99)
Potassium: 3.8 mmol/L (ref 3.5–5.2)
Sodium: 138 mmol/L (ref 134–144)
eGFR: 90 mL/min/{1.73_m2} (ref >59)

## 2022-06-30 LAB — CBC
Hematocrit: 44.2 % (ref 37.5–51.0)
Hemoglobin: 14.8 g/dL (ref 13.0–17.7)
MCH: 31.4 pg (ref 26.6–33.0)
MCHC: 33.5 g/dL (ref 31.5–35.7)
MCV: 94 fL (ref 79–97)
Platelets: 214 10*3/uL (ref 150–450)
RBC: 4.72 x10E6/uL (ref 4.14–5.80)
RDW: 12.1 % (ref 11.6–15.4)
WBC: 7.6 10*3/uL (ref 3.4–10.8)

## 2022-06-30 MED ORDER — SODIUM CHLORIDE 0.9% FLUSH: 3.0000 mL | Freq: Two times a day (BID) | INTRAVENOUS | Status: DC

## 2022-06-30 NOTE — Telephone Encounter (Signed)
Cardiac Catheterization scheduled at [facility] for: Thursday July 01, 2022 11:30 AM Arrival time and place: [place] Entrance A at: 9:30 AM  Nothing to eat after midnight prior to procedure, clear liquids until 5 AM day of procedure.  Medication instructions: -Hold:  HCTZ-AM of procedure  Metformin-day of procedure and 48 hours post procedure  Glipizide-AM of procedure  -Except hold medications usual morning medications can be taken with sips of water including aspirin 81 mg.  Confirmed patient has responsible adult to drive home post procedure and be with patient first 24 hours after arriving home.  Patient reports no new symptoms concerning for COVID-19 in the past 10 days.  Reviewed procedure instructions with patient.

## 2022-07-01 ENCOUNTER — Other Ambulatory Visit: Payer: Self-pay

## 2022-07-01 ENCOUNTER — Ambulatory Visit (HOSPITAL_COMMUNITY)
Admission: RE | Disposition: A | Discharge status: Home / Self Care | Payer: Self-pay | Source: Home / Self Care | Attending: Cardiovascular Disease

## 2022-07-01 ENCOUNTER — Ambulatory Visit (HOSPITAL_COMMUNITY)
Admission: RE | Admit: 2022-07-01 | Discharge: 2022-07-01 | Disposition: A | Discharge status: Home / Self Care | Payer: Medicare Other | Attending: Cardiovascular Disease | Admitting: Cardiovascular Disease

## 2022-07-01 DIAGNOSIS — Z79899 Other long term (current) drug therapy: Secondary | ICD-10-CM | POA: Diagnosis not present

## 2022-07-01 DIAGNOSIS — E785 Hyperlipidemia, unspecified: Secondary | ICD-10-CM | POA: Insufficient documentation

## 2022-07-01 DIAGNOSIS — I2582 Chronic total occlusion of coronary artery: Secondary | ICD-10-CM | POA: Diagnosis not present

## 2022-07-01 DIAGNOSIS — I10 Essential (primary) hypertension: Secondary | ICD-10-CM | POA: Diagnosis not present

## 2022-07-01 DIAGNOSIS — Z87891 Personal history of nicotine dependence: Secondary | ICD-10-CM | POA: Diagnosis not present

## 2022-07-01 DIAGNOSIS — I2510 Atherosclerotic heart disease of native coronary artery without angina pectoris: Secondary | ICD-10-CM | POA: Diagnosis present

## 2022-07-01 DIAGNOSIS — I779 Disorder of arteries and arterioles, unspecified: Secondary | ICD-10-CM | POA: Diagnosis not present

## 2022-07-01 DIAGNOSIS — I350 Nonrheumatic aortic (valve) stenosis: Secondary | ICD-10-CM | POA: Insufficient documentation

## 2022-07-01 DIAGNOSIS — Z951 Presence of aortocoronary bypass graft: Secondary | ICD-10-CM | POA: Diagnosis not present

## 2022-07-01 HISTORY — PX: RIGHT/LEFT HEART CATH AND CORONARY/GRAFT ANGIOGRAPHY: CATH118267

## 2022-07-01 LAB — POCT I-STAT EG7
Acid-Base Excess: 0 mmol/L (ref 0.0–2.0)
Acid-Base Excess: 1 mmol/L (ref 0.0–2.0)
Bicarbonate: 25.2 mmol/L (ref 20.0–28.0)
Bicarbonate: 26 mmol/L (ref 20.0–28.0)
Calcium, Ion: 1.27 mmol/L (ref 1.15–1.40)
Calcium, Ion: 1.29 mmol/L (ref 1.15–1.40)
HCT: 41 % (ref 39.0–52.0)
HCT: 41 % (ref 39.0–52.0)
Hemoglobin: 13.9 g/dL (ref 13.0–17.0)
Hemoglobin: 13.9 g/dL (ref 13.0–17.0)
O2 Saturation: 74 %
O2 Saturation: 76 %
Potassium: 3.6 mmol/L (ref 3.5–5.1)
Potassium: 3.7 mmol/L (ref 3.5–5.1)
Sodium: 139 mmol/L (ref 135–145)
Sodium: 139 mmol/L (ref 135–145)
TCO2: 26 mmol/L (ref 22–32)
TCO2: 27 mmol/L (ref 22–32)
pCO2, Ven: 41 mmHg — ABNORMAL LOW (ref 44–60)
pCO2, Ven: 42.1 mmHg — ABNORMAL LOW (ref 44–60)
pH, Ven: 7.397 (ref 7.25–7.43)
pH, Ven: 7.398 (ref 7.25–7.43)
pO2, Ven: 40 mmHg (ref 32–45)
pO2, Ven: 41 mmHg (ref 32–45)

## 2022-07-01 LAB — POCT I-STAT 7, (LYTES, BLD GAS, ICA,H+H)
Acid-Base Excess: 0 mmol/L (ref 0.0–2.0)
Bicarbonate: 23.8 mmol/L (ref 20.0–28.0)
Calcium, Ion: 1.25 mmol/L (ref 1.15–1.40)
HCT: 40 % (ref 39.0–52.0)
Hemoglobin: 13.6 g/dL (ref 13.0–17.0)
O2 Saturation: 96 %
Potassium: 3.6 mmol/L (ref 3.5–5.1)
Sodium: 139 mmol/L (ref 135–145)
TCO2: 25 mmol/L (ref 22–32)
pCO2 arterial: 36.7 mmHg (ref 32–48)
pH, Arterial: 7.42 (ref 7.35–7.45)
pO2, Arterial: 78 mmHg — ABNORMAL LOW (ref 83–108)

## 2022-07-01 LAB — GLUCOSE, CAPILLARY
Glucose-Capillary: 156 mg/dL — ABNORMAL HIGH (ref 70–99)
Glucose-Capillary: 115 mg/dL — ABNORMAL HIGH (ref 70–99)

## 2022-07-01 SURGERY — RIGHT/LEFT HEART CATH AND CORONARY/GRAFT ANGIOGRAPHY
Anesthesia: LOCAL

## 2022-07-01 MED ORDER — ASPIRIN 81 MG PO TBEC: 81.0000 mg | DELAYED_RELEASE_TABLET | Freq: Every morning | ORAL | Status: DC

## 2022-07-01 MED ORDER — HYDROCHLOROTHIAZIDE 25 MG PO TABS: 25.0000 mg | ORAL_TABLET | Freq: Every morning | ORAL | Status: DC

## 2022-07-01 MED ORDER — ONDANSETRON HCL 4 MG/2ML IJ SOLN: 4.0000 mg | Freq: Four times a day (QID) | INTRAMUSCULAR | Status: DC | PRN

## 2022-07-01 MED ORDER — MORPHINE SULFATE (PF) 2 MG/ML IV SOLN: 2.0000 mg | INTRAVENOUS | Status: DC | PRN

## 2022-07-01 MED ORDER — SODIUM CHLORIDE 0.9 % WEIGHT BASED INFUSION: 1.0000 mL/kg/h | INTRAVENOUS | Status: DC

## 2022-07-01 MED ORDER — ROSUVASTATIN CALCIUM 20 MG PO TABS: 20.0000 mg | ORAL_TABLET | Freq: Every evening | ORAL | Status: DC

## 2022-07-01 MED ORDER — ASPIRIN 81 MG PO CHEW: 81.0000 mg | CHEWABLE_TABLET | ORAL | Status: DC

## 2022-07-01 MED ORDER — PANTOPRAZOLE SODIUM 40 MG PO TBEC: 40.0000 mg | DELAYED_RELEASE_TABLET | Freq: Every day | ORAL | Status: DC

## 2022-07-01 MED ORDER — SODIUM CHLORIDE 0.9% FLUSH: 3.0000 mL | Freq: Two times a day (BID) | INTRAVENOUS | Status: DC

## 2022-07-01 MED ORDER — FENTANYL CITRATE (PF) 100 MCG/2ML IJ SOLN
INTRAMUSCULAR | Status: DC | PRN
  Administered 2022-07-01: 25 ug via INTRAVENOUS

## 2022-07-01 MED ORDER — FENTANYL CITRATE (PF) 100 MCG/2ML IJ SOLN
INTRAMUSCULAR | Status: AC
  Filled 2022-07-01: qty 2

## 2022-07-01 MED ORDER — LABETALOL HCL 5 MG/ML IV SOLN: 10.0000 mg | INTRAVENOUS | Status: DC | PRN

## 2022-07-01 MED ORDER — SODIUM CHLORIDE 0.9% FLUSH: 3.0000 mL | INTRAVENOUS | Status: DC | PRN

## 2022-07-01 MED ORDER — ACETAMINOPHEN 325 MG PO TABS: 650.0000 mg | ORAL_TABLET | ORAL | Status: DC | PRN

## 2022-07-01 MED ORDER — SODIUM CHLORIDE 0.9 % IV SOLN: 250.0000 mL | INTRAVENOUS | Status: DC | PRN

## 2022-07-01 MED ORDER — LISINOPRIL 20 MG PO TABS: 40.0000 mg | ORAL_TABLET | Freq: Every morning | ORAL | Status: DC

## 2022-07-01 MED ORDER — MIDAZOLAM HCL 2 MG/2ML IJ SOLN
INTRAMUSCULAR | Status: DC | PRN
  Administered 2022-07-01: 1 mg via INTRAVENOUS

## 2022-07-01 MED ORDER — LIDOCAINE HCL (PF) 1 % IJ SOLN
INTRAMUSCULAR | Status: DC | PRN
  Administered 2022-07-01: 10 mL

## 2022-07-01 MED ORDER — ASPIRIN 81 MG PO CHEW: 81.0000 mg | CHEWABLE_TABLET | Freq: Every day | ORAL | Status: DC

## 2022-07-01 MED ORDER — HYDRALAZINE HCL 20 MG/ML IJ SOLN: 10.0000 mg | INTRAMUSCULAR | Status: DC | PRN

## 2022-07-01 MED ORDER — SODIUM CHLORIDE 0.9 % IV SOLN: INTRAVENOUS | Status: DC

## 2022-07-01 MED ORDER — GLIPIZIDE ER 5 MG PO TB24: 5.0000 mg | ORAL_TABLET | Freq: Every day | ORAL | Status: DC

## 2022-07-01 MED ORDER — LIDOCAINE HCL (PF) 1 % IJ SOLN
INTRAMUSCULAR | Status: AC
  Filled 2022-07-01: qty 30

## 2022-07-01 MED ORDER — MIDAZOLAM HCL 2 MG/2ML IJ SOLN
INTRAMUSCULAR | Status: AC
  Filled 2022-07-01: qty 2

## 2022-07-01 MED ORDER — HEPARIN (PORCINE) IN NACL 1000-0.9 UT/500ML-% IV SOLN
INTRAVENOUS | Status: DC | PRN
  Administered 2022-07-01 (×2): 500 mL

## 2022-07-01 MED ORDER — SODIUM CHLORIDE 0.9 % WEIGHT BASED INFUSION
3.0000 mL/kg/h | INTRAVENOUS | Status: AC
  Administered 2022-07-01: 3 mL/kg/h via INTRAVENOUS

## 2022-07-01 MED ORDER — METOPROLOL SUCCINATE ER 100 MG PO TB24: 100.0000 mg | ORAL_TABLET | Freq: Every day | ORAL | Status: DC

## 2022-07-01 SURGICAL SUPPLY — 17 items
CATH INFINITI 5 FR LCB (CATHETERS) IMPLANT
CATH INFINITI 5 FR RCB (CATHETERS) IMPLANT
CATH INFINITI 5FR AL1 (CATHETERS) IMPLANT
CATH INFINITI 5FR ANG PIGTAIL (CATHETERS) IMPLANT
CATH INFINITI 5FR MULTPACK ANG (CATHETERS) IMPLANT
CATH SWAN GANZ 7F STRAIGHT (CATHETERS) IMPLANT
CLOSURE MYNX CONTROL 5F (Vascular Products) IMPLANT
CLOSURE MYNX CONTROL 6F/7F (Vascular Products) IMPLANT
KIT HEART LEFT (KITS) ×1 IMPLANT
PACK CARDIAC CATHETERIZATION (CUSTOM PROCEDURE TRAY) ×1 IMPLANT
SHEATH PINNACLE 5F 10CM (SHEATH) IMPLANT
SHEATH PINNACLE 7F 10CM (SHEATH) IMPLANT
SYR MEDRAD MARK 7 150ML (SYRINGE) ×1 IMPLANT
TRANSDUCER W/STOPCOCK (MISCELLANEOUS) ×1 IMPLANT
TUBING CIL FLEX 10 FLL-RA (TUBING) ×1 IMPLANT
TUBING CONTRAST HIGH PRESS 48 (TUBING) IMPLANT
WIRE EMERALD 3MM-J .035X150CM (WIRE) IMPLANT

## 2022-07-01 NOTE — Interval H&P Note (Signed)
Cath Lab Visit (complete for each Cath Lab visit)  Clinical Evaluation Leading to the Procedure:   ACS: No.  Non-ACS:    Anginal Classification: No Symptoms  Anti-ischemic medical therapy: No Therapy  Non-Invasive Test Results: Low-risk stress test findings: cardiac mortality <1%/year  Prior CABG: Previous CABG      History and Physical Interval Note:  07/01/2022 12:20 PM  Jordan Chaney  has presented today for surgery, with the diagnosis of AS.  The various methods of treatment have been discussed with the patient and family. After consideration of risks, benefits and other options for treatment, the patient has consented to  Procedure(s): RIGHT/LEFT HEART CATH AND CORONARY/GRAFT ANGIOGRAPHY (N/A) as a surgical intervention.  The patient's history has been reviewed, patient examined, no change in status, stable for surgery.  I have reviewed the patient's chart and labs.  Questions were answered to the patient's satisfaction.     Jordan Chaney

## 2022-07-02 ENCOUNTER — Encounter (HOSPITAL_COMMUNITY): Payer: Self-pay | Admitting: Cardiovascular Disease

## 2022-07-02 MED FILL — Lidocaine HCl Local Preservative Free (PF) Inj 1%: INTRAMUSCULAR | Qty: 30 | Status: AC

## 2022-07-14 ENCOUNTER — Ambulatory Visit: Payer: Medicare Other | Attending: Cardiovascular Disease | Admitting: Cardiovascular Disease

## 2022-07-14 ENCOUNTER — Encounter: Payer: Self-pay | Admitting: Cardiovascular Disease

## 2022-07-14 VITALS — BP 118/80 | HR 74 | Ht 69.0 in | Wt 256.4 lb

## 2022-07-14 DIAGNOSIS — I350 Nonrheumatic aortic (valve) stenosis: Secondary | ICD-10-CM | POA: Insufficient documentation

## 2022-07-14 DIAGNOSIS — I6523 Occlusion and stenosis of bilateral carotid arteries: Secondary | ICD-10-CM

## 2022-07-14 NOTE — Patient Instructions (Signed)
Medication Instructions:  Your physician recommends that you continue on your current medications as directed. Please refer to the Current Medication list given to you today.  *If you need a refill on your cardiac medications before your next appointment, please call your pharmacy*   Follow-Up: At [facility], you and your health needs are our priority.  As part of our continuing mission to provide you with exceptional heart care, we have created designated Provider Care Teams.  These Care Teams include your primary Cardiologist (physician) and Advanced Practice Providers (APPs -  Physician Assistants and Nurse Practitioners) who all work together to provide you with the care you need, when you need it.  We recommend signing up for the patient portal called "MyChart".  Sign up information is provided on this After Visit Summary.  MyChart is used to connect with patients for Virtual Visits (Telemedicine).  Patients are able to view lab/test results, encounter notes, upcoming appointments, etc.  Non-urgent messages can be sent to your provider as well.   To learn more about what you can do with MyChart, go to [email or web address].    Your next appointment:   3 month(s)  The format for your next appointment:   In Person  Provider:   Jonathan Berry, MD    

## 2022-07-14 NOTE — Progress Notes (Signed)
Jordan Chaney returns today for post procedure follow-up of his right left heart cath which I performed 07/01/2022.  He was originally referred to me by Dr. Claudie Leach for severe aortic stenosis.  I was able to visualize 3 of his 4 bypass grafts.  The diagonal branch graft presumably is closed.  I was unable to cross his aortic valve.  I believe he be a good candidate for TAVR with progressive dyspnea.  I am referring him to the structural heart clinic for further evaluation.  I will see him back in 3 months.  Lorretta Harp, M.D., [place], [facility], [place] [address] [place], [place]  [address]  [phone] 07/14/2022 10:38 AM
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
## 2022-07-22 ENCOUNTER — Encounter: Payer: Self-pay | Admitting: Internal Medicine

## 2022-07-22 ENCOUNTER — Ambulatory Visit: Payer: Medicare Other | Attending: Internal Medicine | Admitting: Internal Medicine

## 2022-07-22 VITALS — BP 124/80 | HR 69 | Ht 69.0 in | Wt 253.0 lb

## 2022-07-22 DIAGNOSIS — I10 Essential (primary) hypertension: Secondary | ICD-10-CM | POA: Diagnosis not present

## 2022-07-22 DIAGNOSIS — I350 Nonrheumatic aortic (valve) stenosis: Secondary | ICD-10-CM | POA: Diagnosis not present

## 2022-07-22 DIAGNOSIS — Z951 Presence of aortocoronary bypass graft: Secondary | ICD-10-CM | POA: Diagnosis not present

## 2022-07-22 DIAGNOSIS — E782 Mixed hyperlipidemia: Secondary | ICD-10-CM | POA: Insufficient documentation

## 2022-07-22 NOTE — Progress Notes (Addendum)
Pre Surgical Assessment: 5 M Walk Test  75M=16.61ft  5 Meter Walk Test- trial 1: 7.84 seconds 5 Meter Walk Test- trial 2: 6.17 seconds 5 Meter Walk Test- trial 3: 7.61 seconds 5 Meter Walk Test Average: 7.21 seconds    __________________________  Procedure Type: Isolated AVR Perioperative Outcome Estimate % Operative Mortality 1.41% Morbidity & Mortality 7.17% Stroke 1.23% Renal Failure 0.916% Reoperation 2.74% Prolonged Ventilation 4.28% Deep Sternal Wound Infection 0.236% Long Hospital Stay (>14 days) 2.58% Short Hospital Stay (<6 days)* 57%

## 2022-07-22 NOTE — Patient Instructions (Signed)
Medication Instructions:  No changes *If you need a refill on your cardiac medications before your next appointment, please call your pharmacy*   Lab Work: None ordered  Testing/Procedures: Your physician has requested that you have an echocardiogram. Echocardiography is a painless test that uses sound waves to create images of your heart. It provides your doctor with information about the size and shape of your heart and how well your heart's chambers and valves are working. This procedure takes approximately one hour. There are no restrictions for this procedure. Please do NOT wear cologne, perfume, aftershave, or lotions (deodorant is allowed). Please arrive 15 minutes prior to your appointment time.   Follow-Up: Per Structural Heart Team

## 2022-08-11 ENCOUNTER — Other Ambulatory Visit: Payer: Self-pay

## 2022-08-11 ENCOUNTER — Ambulatory Visit (HOSPITAL_COMMUNITY): Payer: Medicare Other | Attending: Cardiovascular Disease

## 2022-08-11 DIAGNOSIS — I350 Nonrheumatic aortic (valve) stenosis: Secondary | ICD-10-CM

## 2022-08-11 LAB — ECHOCARDIOGRAM COMPLETE
AR max vel: 1.32 cm2
AV Area VTI: 1.35 cm2
AV Area mean vel: 1.31 cm2
AV Mean grad: 42.8 mmHg
AV Peak grad: 77.4 mmHg
Ao pk vel: 4.4 m/s
Area-P 1/2: 3.42 cm2
S' Lateral: 3.6 cm

## 2022-08-11 MED ORDER — PERFLUTREN LIPID MICROSPHERE
1.0000 mL | INTRAVENOUS | Status: AC | PRN
  Administered 2022-08-11: 1 mL via INTRAVENOUS

## 2022-08-17 LAB — BASIC METABOLIC PANEL
BUN/Creatinine Ratio: 19 (ref 10–24)
BUN: 15 mg/dL (ref 8–27)
CO2: 24 mmol/L (ref 20–29)
Calcium: 9.8 mg/dL (ref 8.6–10.2)
Chloride: 97 mmol/L (ref 96–106)
Creatinine, Ser: 0.8 mg/dL (ref 0.76–1.27)
Glucose: 252 mg/dL — ABNORMAL HIGH (ref 70–99)
Potassium: 4 mmol/L (ref 3.5–5.2)
Sodium: 138 mmol/L (ref 134–144)
eGFR: 95 mL/min/{1.73_m2} (ref >59)

## 2022-08-23 ENCOUNTER — Encounter (HOSPITAL_COMMUNITY): Payer: Self-pay

## 2022-08-23 ENCOUNTER — Ambulatory Visit (HOSPITAL_COMMUNITY)
Admission: RE | Admit: 2022-08-23 | Discharge: 2022-08-23 | Disposition: A | Discharge status: Home / Self Care | Payer: Medicare Other | Source: Ambulatory Visit | Attending: Internal Medicine | Admitting: Internal Medicine

## 2022-08-23 DIAGNOSIS — I350 Nonrheumatic aortic (valve) stenosis: Secondary | ICD-10-CM | POA: Diagnosis present

## 2022-08-23 MED ORDER — METOPROLOL TARTRATE 5 MG/5ML IV SOLN
INTRAVENOUS | Status: AC
  Administered 2022-08-23: 5 mg via INTRAVENOUS
  Filled 2022-08-23: qty 5

## 2022-08-23 MED ORDER — METOPROLOL TARTRATE 5 MG/5ML IV SOLN: 5.0000 mg | Freq: Once | INTRAVENOUS | Status: AC

## 2022-08-23 MED ORDER — IOHEXOL 350 MG/ML SOLN
95.0000 mL | Freq: Once | INTRAVENOUS | Status: AC | PRN
  Administered 2022-08-23: 95 mL via INTRAVENOUS

## 2022-09-02 ENCOUNTER — Encounter: Payer: Self-pay | Admitting: Cardiothoracic Surgery

## 2022-09-02 ENCOUNTER — Institutional Professional Consult (permissible substitution) (INDEPENDENT_AMBULATORY_CARE_PROVIDER_SITE_OTHER): Payer: Medicare Other | Admitting: Cardiothoracic Surgery

## 2022-09-02 VITALS — BP 159/92 | HR 76 | Resp 20 | Ht 69.0 in | Wt 252.0 lb

## 2022-09-02 DIAGNOSIS — I350 Nonrheumatic aortic (valve) stenosis: Secondary | ICD-10-CM | POA: Diagnosis not present
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
## 2022-09-17 ENCOUNTER — Other Ambulatory Visit: Payer: Self-pay

## 2022-09-17 DIAGNOSIS — I350 Nonrheumatic aortic (valve) stenosis: Secondary | ICD-10-CM

## 2022-09-24 ENCOUNTER — Encounter (HOSPITAL_COMMUNITY)
Admission: RE | Admit: 2022-09-24 | Discharge: 2022-09-24 | Disposition: A | Discharge status: Home / Self Care | Payer: Medicare Other | Source: Ambulatory Visit | Attending: Internal Medicine | Admitting: Internal Medicine

## 2022-09-24 ENCOUNTER — Ambulatory Visit (HOSPITAL_COMMUNITY)
Admission: RE | Admit: 2022-09-24 | Discharge: 2022-09-24 | Disposition: A | Discharge status: Home / Self Care | Payer: Medicare Other | Source: Ambulatory Visit | Attending: Internal Medicine | Admitting: Internal Medicine

## 2022-09-24 DIAGNOSIS — Z1152 Encounter for screening for COVID-19: Secondary | ICD-10-CM | POA: Diagnosis not present

## 2022-09-24 DIAGNOSIS — E119 Type 2 diabetes mellitus without complications: Secondary | ICD-10-CM | POA: Diagnosis not present

## 2022-09-24 DIAGNOSIS — I350 Nonrheumatic aortic (valve) stenosis: Secondary | ICD-10-CM | POA: Insufficient documentation

## 2022-09-24 DIAGNOSIS — Z01812 Encounter for preprocedural laboratory examination: Secondary | ICD-10-CM | POA: Insufficient documentation

## 2022-09-24 DIAGNOSIS — Z01818 Encounter for other preprocedural examination: Secondary | ICD-10-CM

## 2022-09-24 LAB — URINALYSIS, ROUTINE W REFLEX MICROSCOPIC
Bilirubin Urine: NEGATIVE
Glucose, UA: NEGATIVE mg/dL
Hgb urine dipstick: NEGATIVE
Ketones, ur: NEGATIVE mg/dL
Leukocytes,Ua: NEGATIVE
Nitrite: NEGATIVE
Protein, ur: NEGATIVE mg/dL
Specific Gravity, Urine: 1.008 (ref 1.005–1.030)
pH: 5 (ref 5.0–8.0)

## 2022-09-24 LAB — HEMOGLOBIN A1C
Hgb A1c MFr Bld: 7.5 % — ABNORMAL HIGH (ref 4.8–5.6)
Mean Plasma Glucose: 168.55 mg/dL

## 2022-09-24 LAB — PROTIME-INR
INR: 1 (ref 0.8–1.2)
Prothrombin Time: 13.3 seconds (ref 11.4–15.2)

## 2022-09-24 LAB — SURGICAL PCR SCREEN
MRSA, PCR: NEGATIVE
Staphylococcus aureus: NEGATIVE

## 2022-09-24 LAB — COMPREHENSIVE METABOLIC PANEL
ALT: 52 U/L — ABNORMAL HIGH (ref 0–44)
AST: 38 U/L (ref 15–41)
Albumin: 4.1 g/dL (ref 3.5–5.0)
Alkaline Phosphatase: 31 U/L — ABNORMAL LOW (ref 38–126)
Anion gap: 12 (ref 5–15)
BUN: 20 mg/dL (ref 8–23)
CO2: 26 mmol/L (ref 22–32)
Calcium: 10.2 mg/dL (ref 8.9–10.3)
Chloride: 97 mmol/L — ABNORMAL LOW (ref 98–111)
Creatinine, Ser: 0.93 mg/dL (ref 0.61–1.24)
GFR, Estimated: >60 mL/min (ref >60)
Glucose, Bld: 140 mg/dL — ABNORMAL HIGH (ref 70–99)
Potassium: 3.6 mmol/L (ref 3.5–5.1)
Sodium: 135 mmol/L (ref 135–145)
Total Bilirubin: 0.9 mg/dL (ref 0.3–1.2)
Total Protein: 6.9 g/dL (ref 6.5–8.1)

## 2022-09-24 LAB — TYPE AND SCREEN
ABO/RH(D): A POS
Antibody Screen: NEGATIVE

## 2022-09-24 LAB — CBC
HCT: 44.9 % (ref 39.0–52.0)
Hemoglobin: 15 g/dL (ref 13.0–17.0)
MCH: 31.3 pg (ref 26.0–34.0)
MCHC: 33.4 g/dL (ref 30.0–36.0)
MCV: 93.7 fL (ref 80.0–100.0)
Platelets: 205 10*3/uL (ref 150–400)
RBC: 4.79 MIL/uL (ref 4.22–5.81)
RDW: 12.2 % (ref 11.5–15.5)
WBC: 7.2 10*3/uL (ref 4.0–10.5)
nRBC: 0 % (ref 0.0–0.2)

## 2022-09-24 LAB — SARS CORONAVIRUS 2 (TAT 6-24 HRS): SARS Coronavirus 2: NEGATIVE

## 2022-09-24 NOTE — Progress Notes (Signed)
TAVR letter instructions reviewed with patient and all questions answered. CHG Soap given. 

## 2022-09-27 MED ORDER — MAGNESIUM SULFATE 50 % IJ SOLN
40.0000 meq | INTRAMUSCULAR | Status: DC
  Filled 2022-09-27 (×2): qty 9.85

## 2022-09-27 MED ORDER — POTASSIUM CHLORIDE 2 MEQ/ML IV SOLN
80.0000 meq | INTRAVENOUS | Status: DC
  Filled 2022-09-27 (×2): qty 40

## 2022-09-27 MED ORDER — HEPARIN 30,000 UNITS/1000 ML (OHS) CELLSAVER SOLUTION
Status: DC
  Filled 2022-09-27 (×2): qty 1000

## 2022-09-27 MED ORDER — CEFAZOLIN SODIUM-DEXTROSE 2-4 GM/100ML-% IV SOLN
2.0000 g | INTRAVENOUS | Status: AC
  Administered 2022-09-28: 2 g via INTRAVENOUS
  Filled 2022-09-27 (×2): qty 100

## 2022-09-27 MED ORDER — NOREPINEPHRINE 4 MG/250ML-% IV SOLN
0.0000 ug/min | INTRAVENOUS | Status: AC
  Administered 2022-09-28: 2 ug/min via INTRAVENOUS
  Filled 2022-09-27: qty 250

## 2022-09-27 MED ORDER — DEXMEDETOMIDINE HCL IN NACL 400 MCG/100ML IV SOLN
0.1000 ug/kg/h | INTRAVENOUS | Status: AC
  Administered 2022-09-28: 115.72 ug via INTRAVENOUS
  Filled 2022-09-27: qty 100

## 2022-09-28 ENCOUNTER — Inpatient Hospital Stay (HOSPITAL_COMMUNITY): Payer: Medicare Other

## 2022-09-28 ENCOUNTER — Inpatient Hospital Stay (HOSPITAL_COMMUNITY): Payer: Medicare Other | Admitting: Certified Registered Nurse Anesthetist

## 2022-09-28 ENCOUNTER — Encounter (HOSPITAL_COMMUNITY)
Admission: RE | Disposition: A | Discharge status: Home / Self Care | Payer: Self-pay | Source: Home / Self Care | Attending: Internal Medicine

## 2022-09-28 ENCOUNTER — Inpatient Hospital Stay (HOSPITAL_COMMUNITY): Payer: Medicare Other | Admitting: Physician Assistant

## 2022-09-28 ENCOUNTER — Encounter (HOSPITAL_COMMUNITY): Payer: Self-pay | Admitting: Internal Medicine

## 2022-09-28 ENCOUNTER — Inpatient Hospital Stay (HOSPITAL_COMMUNITY)
Admission: RE | Admit: 2022-09-28 | Discharge: 2022-09-29 | DRG: 267 | Disposition: A | Discharge status: Home / Self Care | Payer: Medicare Other | Attending: Internal Medicine | Admitting: Internal Medicine

## 2022-09-28 ENCOUNTER — Other Ambulatory Visit: Payer: Self-pay

## 2022-09-28 ENCOUNTER — Other Ambulatory Visit: Payer: Self-pay | Admitting: Physician Assistant

## 2022-09-28 DIAGNOSIS — Z7984 Long term (current) use of oral hypoglycemic drugs: Secondary | ICD-10-CM

## 2022-09-28 DIAGNOSIS — Z87891 Personal history of nicotine dependence: Secondary | ICD-10-CM

## 2022-09-28 DIAGNOSIS — I6529 Occlusion and stenosis of unspecified carotid artery: Secondary | ICD-10-CM | POA: Diagnosis present

## 2022-09-28 DIAGNOSIS — I350 Nonrheumatic aortic (valve) stenosis: Secondary | ICD-10-CM

## 2022-09-28 DIAGNOSIS — Z96652 Presence of left artificial knee joint: Secondary | ICD-10-CM | POA: Diagnosis present

## 2022-09-28 DIAGNOSIS — M109 Gout, unspecified: Secondary | ICD-10-CM | POA: Diagnosis present

## 2022-09-28 DIAGNOSIS — I6522 Occlusion and stenosis of left carotid artery: Secondary | ICD-10-CM | POA: Diagnosis present

## 2022-09-28 DIAGNOSIS — E119 Type 2 diabetes mellitus without complications: Secondary | ICD-10-CM | POA: Diagnosis present

## 2022-09-28 DIAGNOSIS — E785 Hyperlipidemia, unspecified: Secondary | ICD-10-CM | POA: Diagnosis present

## 2022-09-28 DIAGNOSIS — N2889 Other specified disorders of kidney and ureter: Secondary | ICD-10-CM | POA: Diagnosis present

## 2022-09-28 DIAGNOSIS — Z825 Family history of asthma and other chronic lower respiratory diseases: Secondary | ICD-10-CM

## 2022-09-28 DIAGNOSIS — Z952 Presence of prosthetic heart valve: Secondary | ICD-10-CM

## 2022-09-28 DIAGNOSIS — I2510 Atherosclerotic heart disease of native coronary artery without angina pectoris: Secondary | ICD-10-CM

## 2022-09-28 DIAGNOSIS — E7800 Pure hypercholesterolemia, unspecified: Secondary | ICD-10-CM | POA: Diagnosis present

## 2022-09-28 DIAGNOSIS — I352 Nonrheumatic aortic (valve) stenosis with insufficiency: Secondary | ICD-10-CM | POA: Diagnosis present

## 2022-09-28 DIAGNOSIS — Z7982 Long term (current) use of aspirin: Secondary | ICD-10-CM

## 2022-09-28 DIAGNOSIS — I10 Essential (primary) hypertension: Secondary | ICD-10-CM | POA: Diagnosis present

## 2022-09-28 DIAGNOSIS — Z951 Presence of aortocoronary bypass graft: Secondary | ICD-10-CM | POA: Diagnosis not present

## 2022-09-28 DIAGNOSIS — E876 Hypokalemia: Secondary | ICD-10-CM | POA: Diagnosis not present

## 2022-09-28 DIAGNOSIS — Z006 Encounter for examination for normal comparison and control in clinical research program: Secondary | ICD-10-CM

## 2022-09-28 DIAGNOSIS — Z79899 Other long term (current) drug therapy: Secondary | ICD-10-CM | POA: Diagnosis not present

## 2022-09-28 DIAGNOSIS — K219 Gastro-esophageal reflux disease without esophagitis: Secondary | ICD-10-CM | POA: Diagnosis present

## 2022-09-28 DIAGNOSIS — Z8249 Family history of ischemic heart disease and other diseases of the circulatory system: Secondary | ICD-10-CM | POA: Diagnosis not present

## 2022-09-28 DIAGNOSIS — Z6837 Body mass index (BMI) 37.0-37.9, adult: Secondary | ICD-10-CM

## 2022-09-28 HISTORY — PX: INTRAOPERATIVE TRANSTHORACIC ECHOCARDIOGRAM: SHX6523

## 2022-09-28 HISTORY — PX: TRANSCATHETER AORTIC VALVE REPLACEMENT, TRANSFEMORAL: SHX6400

## 2022-09-28 LAB — ECHOCARDIOGRAM LIMITED
AR max vel: 2.42 cm2
AV Area VTI: 2.7 cm2
AV Area mean vel: 1.98 cm2
AV Mean grad: 6 mmHg
AV Peak grad: 9.2 mmHg
Ao pk vel: 1.52 m/s

## 2022-09-28 LAB — POCT I-STAT, CHEM 8
BUN: 17 mg/dL (ref 8–23)
Calcium, Ion: 1.27 mmol/L (ref 1.15–1.40)
Chloride: 100 mmol/L (ref 98–111)
Creatinine, Ser: 0.6 mg/dL — ABNORMAL LOW (ref 0.61–1.24)
Glucose, Bld: 193 mg/dL — ABNORMAL HIGH (ref 70–99)
HCT: 38 % — ABNORMAL LOW (ref 39.0–52.0)
Hemoglobin: 12.9 g/dL — ABNORMAL LOW (ref 13.0–17.0)
Potassium: 3.8 mmol/L (ref 3.5–5.1)
Sodium: 137 mmol/L (ref 135–145)
TCO2: 24 mmol/L (ref 22–32)

## 2022-09-28 LAB — GLUCOSE, CAPILLARY
Glucose-Capillary: 172 mg/dL — ABNORMAL HIGH (ref 70–99)
Glucose-Capillary: 184 mg/dL — ABNORMAL HIGH (ref 70–99)
Glucose-Capillary: 197 mg/dL — ABNORMAL HIGH (ref 70–99)

## 2022-09-28 SURGERY — IMPLANTATION, AORTIC VALVE, TRANSCATHETER, FEMORAL APPROACH
Anesthesia: Monitor Anesthesia Care

## 2022-09-28 MED ORDER — HEPARIN SODIUM (PORCINE) 1000 UNIT/ML IJ SOLN
INTRAMUSCULAR | Status: DC | PRN
  Administered 2022-09-28: 18000 [IU] via INTRAVENOUS

## 2022-09-28 MED ORDER — CHLORHEXIDINE GLUCONATE 4 % EX LIQD: 30.0000 mL | CUTANEOUS | Status: DC

## 2022-09-28 MED ORDER — ONDANSETRON HCL 4 MG/2ML IJ SOLN: 4.0000 mg | Freq: Four times a day (QID) | INTRAMUSCULAR | Status: DC | PRN

## 2022-09-28 MED ORDER — NITROGLYCERIN IN D5W 200-5 MCG/ML-% IV SOLN
0.0000 ug/min | INTRAVENOUS | Status: DC
  Filled 2022-09-28: qty 250

## 2022-09-28 MED ORDER — SODIUM CHLORIDE 0.9 % IV SOLN: INTRAVENOUS | Status: AC

## 2022-09-28 MED ORDER — MELOXICAM 7.5 MG PO TABS
15.0000 mg | ORAL_TABLET | Freq: Every morning | ORAL | Status: DC
  Administered 2022-09-29: 15 mg via ORAL
  Filled 2022-09-28: qty 2

## 2022-09-28 MED ORDER — HEPARIN (PORCINE) IN NACL 1000-0.9 UT/500ML-% IV SOLN
INTRAVENOUS | Status: AC
  Filled 2022-09-28: qty 500

## 2022-09-28 MED ORDER — PHENYLEPHRINE 80 MCG/ML (10ML) SYRINGE FOR IV PUSH (FOR BLOOD PRESSURE SUPPORT)
PREFILLED_SYRINGE | INTRAVENOUS | Status: DC | PRN
  Administered 2022-09-28 (×4): 80 ug via INTRAVENOUS

## 2022-09-28 MED ORDER — CHLORHEXIDINE GLUCONATE 4 % EX LIQD: 60.0000 mL | Freq: Once | CUTANEOUS | Status: DC

## 2022-09-28 MED ORDER — POTASSIUM CHLORIDE CRYS ER 10 MEQ PO TBCR: 10.0000 meq | EXTENDED_RELEASE_TABLET | ORAL | Status: DC

## 2022-09-28 MED ORDER — CHLORHEXIDINE GLUCONATE 0.12 % MT SOLN
OROMUCOSAL | Status: AC
  Administered 2022-09-28: 15 mL via OROMUCOSAL
  Filled 2022-09-28: qty 15

## 2022-09-28 MED ORDER — TRAMADOL HCL 50 MG PO TABS: 50.0000 mg | ORAL_TABLET | ORAL | Status: DC | PRN

## 2022-09-28 MED ORDER — PANTOPRAZOLE SODIUM 40 MG PO TBEC
40.0000 mg | DELAYED_RELEASE_TABLET | Freq: Every day | ORAL | Status: DC
  Administered 2022-09-28 – 2022-09-29 (×2): 40 mg via ORAL
  Filled 2022-09-28 (×2): qty 1

## 2022-09-28 MED ORDER — FOLIC ACID 1 MG PO TABS
1.0000 mg | ORAL_TABLET | Freq: Every day | ORAL | Status: DC
  Administered 2022-09-28 – 2022-09-29 (×2): 1 mg via ORAL
  Filled 2022-09-28 (×2): qty 1

## 2022-09-28 MED ORDER — CLEVIDIPINE BUTYRATE 0.5 MG/ML IV EMUL
INTRAVENOUS | Status: DC | PRN
  Administered 2022-09-28: 4 mg/h via INTRAVENOUS

## 2022-09-28 MED ORDER — IOHEXOL 350 MG/ML SOLN
INTRAVENOUS | Status: DC | PRN
  Administered 2022-09-28: 130 mL via INTRA_ARTERIAL

## 2022-09-28 MED ORDER — HYDROCHLOROTHIAZIDE 25 MG PO TABS
25.0000 mg | ORAL_TABLET | Freq: Every morning | ORAL | Status: DC
  Administered 2022-09-29: 25 mg via ORAL
  Filled 2022-09-28: qty 1

## 2022-09-28 MED ORDER — OXYCODONE HCL 5 MG PO TABS
5.0000 mg | ORAL_TABLET | ORAL | Status: DC | PRN
  Administered 2022-09-28: 5 mg via ORAL
  Administered 2022-09-28 – 2022-09-29 (×2): 10 mg via ORAL
  Filled 2022-09-28 (×2): qty 2
  Filled 2022-09-28: qty 1

## 2022-09-28 MED ORDER — EPHEDRINE SULFATE-NACL 50-0.9 MG/10ML-% IV SOSY
PREFILLED_SYRINGE | INTRAVENOUS | Status: DC | PRN
  Administered 2022-09-28 (×2): 2.5 mg via INTRAVENOUS

## 2022-09-28 MED ORDER — FENTANYL CITRATE (PF) 100 MCG/2ML IJ SOLN
INTRAMUSCULAR | Status: DC | PRN
  Administered 2022-09-28: 25 ug via INTRAVENOUS

## 2022-09-28 MED ORDER — LACTATED RINGERS IV SOLN: INTRAVENOUS | Status: DC

## 2022-09-28 MED ORDER — CHLORHEXIDINE GLUCONATE 0.12 % MT SOLN: 15.0000 mL | Freq: Once | OROMUCOSAL | Status: AC

## 2022-09-28 MED ORDER — ACETAMINOPHEN 650 MG RE SUPP: 650.0000 mg | Freq: Four times a day (QID) | RECTAL | Status: DC | PRN

## 2022-09-28 MED ORDER — HEPARIN (PORCINE) IN NACL 1000-0.9 UT/500ML-% IV SOLN
INTRAVENOUS | Status: DC | PRN
  Administered 2022-09-28 (×2): 500 mL

## 2022-09-28 MED ORDER — ASPIRIN 81 MG PO TBEC
81.0000 mg | DELAYED_RELEASE_TABLET | Freq: Every morning | ORAL | Status: DC
  Administered 2022-09-29: 81 mg via ORAL
  Filled 2022-09-28: qty 1

## 2022-09-28 MED ORDER — ACETAMINOPHEN 325 MG PO TABS: 650.0000 mg | ORAL_TABLET | Freq: Four times a day (QID) | ORAL | Status: DC | PRN

## 2022-09-28 MED ORDER — INSULIN ASPART 100 UNIT/ML IJ SOLN
0.0000 [IU] | Freq: Three times a day (TID) | INTRAMUSCULAR | Status: DC
  Administered 2022-09-28 – 2022-09-29 (×3): 4 [IU] via SUBCUTANEOUS

## 2022-09-28 MED ORDER — PROPOFOL 500 MG/50ML IV EMUL
INTRAVENOUS | Status: DC | PRN
  Administered 2022-09-28: 20 ug/kg/min via INTRAVENOUS

## 2022-09-28 MED ORDER — SODIUM CHLORIDE 0.9% FLUSH
3.0000 mL | Freq: Two times a day (BID) | INTRAVENOUS | Status: DC
  Administered 2022-09-29: 3 mL via INTRAVENOUS

## 2022-09-28 MED ORDER — INSULIN ASPART 100 UNIT/ML IJ SOLN: 0.0000 [IU] | INTRAMUSCULAR | Status: DC | PRN

## 2022-09-28 MED ORDER — SODIUM CHLORIDE 0.9% FLUSH: 3.0000 mL | INTRAVENOUS | Status: DC | PRN

## 2022-09-28 MED ORDER — LISINOPRIL 20 MG PO TABS
40.0000 mg | ORAL_TABLET | Freq: Every morning | ORAL | Status: DC
  Administered 2022-09-29: 40 mg via ORAL
  Filled 2022-09-28: qty 2

## 2022-09-28 MED ORDER — ALLOPURINOL 100 MG PO TABS
100.0000 mg | ORAL_TABLET | ORAL | Status: DC
  Administered 2022-09-29: 100 mg via ORAL
  Filled 2022-09-28: qty 1

## 2022-09-28 MED ORDER — SODIUM CHLORIDE 0.9 % IV SOLN: INTRAVENOUS | Status: DC

## 2022-09-28 MED ORDER — PROPOFOL 10 MG/ML IV BOLUS
INTRAVENOUS | Status: DC | PRN
  Administered 2022-09-28: 10 mg via INTRAVENOUS

## 2022-09-28 MED ORDER — CHLORHEXIDINE GLUCONATE 0.12 % MT SOLN
15.0000 mL | Freq: Once | OROMUCOSAL | Status: DC
  Filled 2022-09-28: qty 15

## 2022-09-28 MED ORDER — ROSUVASTATIN CALCIUM 20 MG PO TABS
20.0000 mg | ORAL_TABLET | Freq: Every evening | ORAL | Status: DC
  Administered 2022-09-28: 20 mg via ORAL
  Filled 2022-09-28: qty 1

## 2022-09-28 MED ORDER — ORAL CARE MOUTH RINSE: 15.0000 mL | Freq: Once | OROMUCOSAL | Status: AC

## 2022-09-28 MED ORDER — PROTAMINE SULFATE 10 MG/ML IV SOLN
INTRAVENOUS | Status: DC | PRN
  Administered 2022-09-28: 170 mg via INTRAVENOUS
  Administered 2022-09-28: 10 mg via INTRAVENOUS

## 2022-09-28 MED ORDER — LIDOCAINE HCL (PF) 1 % IJ SOLN
INTRAMUSCULAR | Status: AC
  Filled 2022-09-28: qty 30

## 2022-09-28 MED ORDER — ONDANSETRON HCL 4 MG/2ML IJ SOLN
INTRAMUSCULAR | Status: DC | PRN
  Administered 2022-09-28: 4 mg via INTRAVENOUS

## 2022-09-28 MED ORDER — CEFAZOLIN SODIUM-DEXTROSE 2-4 GM/100ML-% IV SOLN
2.0000 g | Freq: Three times a day (TID) | INTRAVENOUS | Status: AC
  Administered 2022-09-28 – 2022-09-29 (×2): 2 g via INTRAVENOUS
  Filled 2022-09-28 (×2): qty 100

## 2022-09-28 MED ORDER — LIDOCAINE HCL (PF) 1 % IJ SOLN
INTRAMUSCULAR | Status: DC | PRN
  Administered 2022-09-28: 2 mL
  Administered 2022-09-28 (×2): 12 mL

## 2022-09-28 MED ORDER — SODIUM CHLORIDE 0.9 % IV SOLN: 250.0000 mL | INTRAVENOUS | Status: DC | PRN

## 2022-09-28 MED ORDER — MORPHINE SULFATE (PF) 2 MG/ML IV SOLN: 1.0000 mg | INTRAVENOUS | Status: DC | PRN

## 2022-09-28 SURGICAL SUPPLY — 40 items
BAG SNAP BAND KOVER 36X36 (MISCELLANEOUS) ×2 IMPLANT
BALLN TRUE 22X4.5 (BALLOONS) ×1
BALLOON TRUE 22X4.5 (BALLOONS) IMPLANT
CATH DIAG 6FR PIGTAIL ANGLED (CATHETERS) IMPLANT
CATH INFINITI 5 FR STR PIGTAIL (CATHETERS) IMPLANT
CATH INFINITI 6F AL1 (CATHETERS) IMPLANT
CATH INFINITI 6F AL2 (CATHETERS) IMPLANT
CLOSURE MYNX CONTROL 6F/7F (Vascular Products) IMPLANT
CLOSURE PERCLOSE PROSTYLE (VASCULAR PRODUCTS) IMPLANT
DRYSEAL FLEXSHEATH 18FR 33CM (SHEATH) ×1
ELECT DEFIB PAD ADLT CADENCE (PAD) IMPLANT
KIT HEART LEFT (KITS) ×1 IMPLANT
MAT PREVALON FULL STRYKER (MISCELLANEOUS) IMPLANT
PACK CARDIAC CATHETERIZATION (CUSTOM PROCEDURE TRAY) ×1 IMPLANT
PROTECTION STATION PRESSURIZED (MISCELLANEOUS) ×1
SHEATH DRYSEAL FLEX 18FR 33CM (SHEATH) IMPLANT
SHEATH PINNACLE 6F 10CM (SHEATH) IMPLANT
SHEATH PINNACLE 8F 10CM (SHEATH) IMPLANT
SHEATH PROBE COVER 6X72 (BAG) IMPLANT
SLEEVE REPOSITIONING LENGTH 30 (MISCELLANEOUS) IMPLANT
STATION PROTECTION PRESSURIZED (MISCELLANEOUS) IMPLANT
STOPCOCK MORSE 400PSI 3WAY (MISCELLANEOUS) ×2 IMPLANT
SYS EVOLUT FX DELIVERY 34 (CATHETERS) ×1
SYS EVOLUT FX LOADING 34 (CATHETERS) ×1
SYSTEM EVOLUT FX DELIVERY 34 (CATHETERS) IMPLANT
SYSTEM EVOLUT FX LOADING 34 (CATHETERS) IMPLANT
TRANSDUCER W/STOPCOCK (MISCELLANEOUS) ×2 IMPLANT
TUBING ART PRESS 72  MALE/FEM (TUBING) ×1
TUBING ART PRESS 72 MALE/FEM (TUBING) IMPLANT
TUBING CIL FLEX 10 FLL-RA (TUBING) IMPLANT
TUBING CONTRAST HIGH PRESS 48 (TUBING) IMPLANT
VALVE EVOLUT FX 34 (Valve) IMPLANT
WIRE AMPLATZ SS-J .035X180CM (WIRE) IMPLANT
WIRE EMERALD 3MM-J .035X150CM (WIRE) IMPLANT
WIRE EMERALD 3MM-J .035X260CM (WIRE) IMPLANT
WIRE EMERALD ST .035X260CM (WIRE) ×1 IMPLANT
WIRE MICRO SET SILHO 5FR 7 (SHEATH) IMPLANT
WIRE MICROINTRODUCER 60CM (WIRE) IMPLANT
WIRE PACING TEMP ST TIP 5 (CATHETERS) IMPLANT
WIRE SAFARI SM CURVE 275 (WIRE) IMPLANT

## 2022-09-28 NOTE — Interval H&P Note (Signed)
History and Physical Interval Note:  09/28/2022 7:12 AM  Jordan Chaney  has presented today for surgery, with the diagnosis of Severe Aortic Stenosis.  The various methods of treatment have been discussed with the patient and family. After consideration of risks, benefits and other options for treatment, the patient has consented to  Procedure(s): Transcatheter Aortic Valve Replacement, Transfemoral (N/A) INTRAOPERATIVE TRANSTHORACIC ECHOCARDIOGRAM (N/A) as a surgical intervention.  The patient's history has been reviewed, patient examined, no change in status, stable for surgery.  I have reviewed the patient's chart and labs.  Questions were answered to the patient's satisfaction.     Pierre Bali Evelyn Moch

## 2022-09-28 NOTE — Transfer of Care (Signed)
Immediate Anesthesia Transfer of Care Note  Patient: Shante M[place]et  Procedure(s) Performed: Transcatheter Aortic Valve Replacement, Transfemoral INTRAOPERATIVE TRANSTHORACIC ECHOCARDIOGRAM  Patient Location: Cath Lab  Anesthesia Type:MAC  Level of Consciousness: drowsy and patient cooperative  Airway & Oxygen Therapy: Patient Spontanous Breathing and Patient connected to nasal cannula oxygen  Post-op Assessment: Report given to RN and Post -op Vital signs reviewed and stable  Post vital signs: Reviewed and stable  Last Vitals:  Vitals Value Taken Time  BP 94/59 (NIBP) Aline: 107/57 09/28/22 1159  Temp    Pulse 61 09/28/22 1201  Resp 19 09/28/22 1202  SpO2 91 % 09/28/22 1201  Vitals shown include unvalidated device data.  Last Pain:  Vitals:   09/28/22 1134  TempSrc:   PainSc: 0-No pain      Patients Stated Pain Goal: 0 (40/34/74 2595)  Complications: There were no known notable events for this encounter.
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
## 2022-09-28 NOTE — Op Note (Signed)
[facility]   MULTIDISCIPLINARY HEART VALVE TEAM   TAVR OPERATIVE NOTE   Date of Procedure:  09/28/2022   Preoperative Diagnosis: Severe Aortic Stenosis   Postoperative Diagnosis: Same   Procedure:   Transcatheter Aortic Valve Replacement - Percutaneous  Transfemoral Approach  Medtronic Evolut FX (size 34 mm, serial # [id number] )   Co-Surgeons:  Justice Rocher, MD and Lenna Sciara, MD  Anesthesiologist:  Suzette Battiest, MD  Echocardiographer:  Jenkins Rouge, MD  Pre-operative Echo Findings: Severe aortic stenosis Normal left ventricular systolic function  Post-operative Echo Findings: Trace paravalvular leak Normal left ventricular systolic function  Left heart catheterization: LVEDP 29mmHg  BRIEF CLINICAL NOTE AND INDICATIONS FOR SURGERY  The patient is a 72 year old male with history of coronary artery disease status post CABG consisting of a LIMA to LAD, occluded vein graft to diagonal, vein graft to obtuse marginal, and vein graft to PDA, BMI of 37, hypertension, hyperlipidemia, type 2 diabetes, and severe symptomatic aortic stenosis who was referred for elective transcatheter aortic valve replacement with 34 mm Evolut fracture Medtronic self-expanding bioprosthesis.  During the course of the patient's preoperative work up they have been evaluated comprehensively by a multidisciplinary team of specialists coordinated through the [facility] in the [facility].  They have been demonstrated to suffer from symptomatic severe aortic stenosis as noted above. The patient has been counseled extensively as to the relative risks and benefits of all options for the treatment of severe aortic stenosis including long term medical therapy, conventional surgery for aortic valve replacement, and transcatheter aortic valve replacement.  The patient has been independently evaluated in formal cardiac surgical consultation by  Dr Tenny Craw, who deemed the patient appropriate for TAVR. Based upon review of all of the patient's preoperative diagnostic tests they are felt to be candidate for transcatheter aortic valve replacement using the transfemoral approach as an alternative to conventional surgery.    Following the decision to proceed with transcatheter aortic valve replacement, a discussion has been held regarding what types of management strategies would be attempted intraoperatively in the event of life-threatening complications, including whether or not the patient would be considered a candidate for the use of cardiopulmonary bypass and/or conversion to open sternotomy for attempted surgical intervention.  The patient has been advised of a variety of complications that might develop peculiar to this approach including but not limited to risks of death, stroke, paravalvular leak, aortic dissection or other major vascular complications, aortic annulus rupture, device embolization, cardiac rupture or perforation, acute myocardial infarction, arrhythmia, heart block or bradycardia requiring permanent pacemaker placement, congestive heart failure, respiratory failure, renal failure, pneumonia, infection, other late complications related to structural valve deterioration or migration, or other complications that might ultimately cause a temporary or permanent loss of functional independence or other long term morbidity.  The patient provides full informed consent for the procedure as described and all questions were answered preoperatively.  DETAILS OF THE OPERATIVE PROCEDURE  PREPARATION:   The patient is brought to the operating room on the above mentioned date and central monitoring was established by the anesthesia team including placement of a radial arterial line. The patient is placed in the supine position on the operating table.  Intravenous antibiotics are administered. The patient is monitored closely throughout the  procedure under conscious sedation.      Baseline transthoracic echocardiogram is performed. The patient's chest, abdomen, both groins, and both lower extremities are prepared and draped in a  sterile manner. A time out procedure is performed.   PERIPHERAL ACCESS:   Using ultrasound guidance, femoral arterial access is obtained with placement of 6 Fr sheaths on the left side.  [place] images are captured and digitally stored in the patient's record. A pigtail diagnostic catheter was passed through the femoral arterial sheath under fluoroscopic guidance into the aortic root (non-coronary cusp).  A temporary transvenous pacemaker catheter was passed through the right internal jugular venous sheath under fluoroscopic guidance into the right ventricle.  The pacemaker was tested to ensure stable lead placement and pacemaker capture.   TRANSFEMORAL ACCESS:  A micropuncture technique is used to access the right femoral artery under fluoroscopic and ultrasound guidance.  [place] images are captured and digitally stored in the patient's chart. 2 Perclose devices are deployed at 10' and 2' positions to 'PreClose' the femoral artery. An 8 French sheath is placed and then an Amplatz Superstiff wire is advanced through the sheath. This is changed out for an 18 Fr Dry-Seal sheath after progressively dilating over the Superstiff wire.  An AL-1 catheter was used to direct a straight-tip exchange length wire across the native aortic valve into the left ventricle. This was exchanged out for a pigtail catheter and position was confirmed in the LV apex.  Simultaneous LV and Ao pressures were recorded.  The pigtail catheter was exchanged for a Safari wire in the LV apex.    BALLOON AORTIC VALVULOPLASTY:  Balloon aortic valvuloplasty with 23 mm true balloon was performed with rapid right ventricular pacing.  TRANSCATHETER HEART VALVE DEPLOYMENT:  A Medtronic Evolut FX transcatheter heart valve (size 34 mm) was prepared and crimped  per manufacturer's guidelines, and the proper orientation of the valve is confirmed on the Medtronic delivery system. The valve was advanced through the introducer sheath and across the aortic arch over the [facility] wire until it is positioned at the base of the pigtail catheter in the aortic valve annulus. Using the fine tuning wheel, valve deployment begins until annular contact is made. Controlled ventricular pacing is performed. Once proper position is confirmed via aortic root angiograms in the cusp overlap and LAO projections, the valve is deployed to 80% where it is fully functional. The patient's hemodynamic recovery following valve deployment is good.  Final position is confirmed and the valve is slowly deployed over 30 seconds until both paddles are released. The delivery catheter and Confida wire are removed and the valve is assessed with echocardiography. Echo demostrated acceptable post-procedural gradients, stable mitral valve function, and trace aortic insufficiency.    PROCEDURE COMPLETION:  The sheath was removed and femoral artery closure is performed using the 2 previously deployed Perclose devices.  Protamine is administered once femoral arterial repair was complete. The site is clear with no evidence of bleeding or hematoma after the sutures are tightened. The temporary pacemaker and pigtail catheters are removed. Mynx closure  is used for contralateral femoral arterial hemostasis for the 6 Fr sheath.  The patient tolerated the procedure well and is transported to the recovery area in stable condition. There were no immediate intraoperative complications. All sponge instrument and needle counts are verified correct at completion of the operation.   The patient received a total of 130 mL of intravenous contrast during the procedure.   Lenna Sciara, MD @td  @now
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
## 2022-09-28 NOTE — Progress Notes (Signed)
Mobility Specialist Progress Note   09/28/22 1830  Mobility  Activity Ambulated with assistance in hallway  Level of Assistance Contact guard assist, steadying assist  Assistive Device Front wheel walker  Distance Ambulated (ft) 220 ft  Activity Response Tolerated well  Mobility Referral Yes  $Mobility charge 1 Mobility   Pre Mobility: 86 HR During Mobility: 105 HR Post Mobility: 95HR  Received pt sitting EOB having no complaints and agreeable. No faults during ambulation, returned back to EOB w/ pt c/o slight fatigue but VSS and groin sites stable. Call bell in reach.  Holland Falling Mobility Specialist Please contact via SecureChat or  Rehab office at [phone]

## 2022-09-28 NOTE — Anesthesia Procedure Notes (Signed)
Arterial Line Insertion Start/End1/16/2024 7:50 AM, 09/28/2022 7:58 AM Performed by: Suzette Battiest, MD, anesthesiologist  Patient location: Pre-op. Preanesthetic checklist: patient identified, IV checked, site marked, risks and benefits discussed, surgical consent, monitors and equipment checked, pre-op evaluation, timeout performed and anesthesia consent Lidocaine 1% used for infiltration Right, radial was placed Catheter size: 20 G Hand hygiene performed  and maximum sterile barriers used   Attempts: 1 Procedure performed using ultrasound guided technique. Ultrasound Notes:anatomy identified, needle tip was noted to be adjacent to the nerve/plexus identified, no ultrasound evidence of intravascular and/or intraneural injection and image(s) printed for medical record Following insertion, dressing applied and Biopatch. Post procedure assessment: normal and unchanged  Post procedure complications: unsuccessful attempts and second provider assisted. Patient tolerated the procedure well with no immediate complications.

## 2022-09-28 NOTE — Anesthesia Preprocedure Evaluation (Signed)
Anesthesia Evaluation  Patient identified by MRN, date of birth, ID band Patient awake    Reviewed: Allergy & Precautions, NPO status , Patient's Chart, lab work & pertinent test results  Airway Mallampati: II  TM Distance: >3 FB Neck ROM: Full    Dental   Pulmonary former smoker   breath sounds clear to auscultation       Cardiovascular hypertension, Pt. on medications and Pt. on home beta blockers + CAD  + Valvular Problems/Murmurs AS  Rhythm:Regular Rate:Normal     Neuro/Psych negative neurological ROS     GI/Hepatic Neg liver ROS, hiatal hernia,GERD  ,,  Endo/Other  diabetes, Type 2    Renal/GU negative Renal ROS     Musculoskeletal  (+) Arthritis ,    Abdominal   Peds  Hematology negative hematology ROS (+)   Anesthesia Other Findings   Reproductive/Obstetrics                             Anesthesia Physical Anesthesia Plan  ASA: 4  Anesthesia Plan: MAC   Post-op Pain Management: Tylenol PO (pre-op)*   Induction:   PONV Risk Score and Plan: 1 and Propofol infusion and Ondansetron  Airway Management Planned: Natural Airway and Simple Face Mask  Additional Equipment:   Intra-op Plan:   Post-operative Plan:   Informed Consent: I have reviewed the patients History and Physical, chart, labs and discussed the procedure including the risks, benefits and alternatives for the proposed anesthesia with the patient or authorized representative who has indicated his/her understanding and acceptance.       Plan Discussed with:   Anesthesia Plan Comments:        Anesthesia Quick Evaluation

## 2022-09-28 NOTE — Progress Notes (Signed)
Right IJ removed by Randall Hiss- RN at 12:45 with pressure held.  6 french sheath.  Bandaged with tagaderm and 4x4.  Pt advised to keep the area clean and dry and to be mindfull of bleeding.

## 2022-09-28 NOTE — Anesthesia Procedure Notes (Signed)
Procedure Name: MAC Date/Time: 09/28/2022 9:17 AM  Performed by: Janene Harvey, CRNAPre-anesthesia Checklist: Patient identified, Emergency Drugs available, Suction available and Patient being monitored Patient Re-evaluated:Patient Re-evaluated prior to induction Oxygen Delivery Method: Simple face mask Induction Type: IV induction Placement Confirmation: positive ETCO2 Dental Injury: Teeth and Oropharynx as per pre-operative assessment

## 2022-09-28 NOTE — Discharge Instructions (Signed)
ACTIVITY AND EXERCISE  Daily activity and exercise are an important part of your recovery. People recover at different rates depending on their general health and type of valve procedure.  Most people recovering from TAVR feel better relatively quickly   No lifting, pushing, pulling more than 10 pounds (examples to avoid: groceries, vacuuming, gardening, golfing):             - For one week with a procedure through the groin.             - For six weeks for procedures through the chest wall or neck. NOTE: You will typically see one of our providers 7-14 days after your procedure to discuss WHEN TO RESUME the above activities.      DRIVING  Do not drive until you are seen for follow up and cleared by a provider. Generally, we ask patient to not drive for 1 week after their procedure.  If you have been told by your doctor in the past that you may not drive, you must talk with him/her before you begin driving again.   DRESSING  Groin site: you may leave the clear dressing over the site for up to one week or until it falls off.   HYGIENE  If you had a femoral (leg) procedure, you may take a shower when you return home. After the shower, pat the site dry. Do NOT use powder, oils or lotions in your groin area until the site has completely healed.  If you had a chest procedure, you may shower when you return home unless specifically instructed not to by your discharging practitioner.             - DO NOT scrub incision; pat dry with a towel.             - DO NOT apply any lotions, oils, powders to the incision.             - No tub baths / swimming for at least 2 weeks.  If you notice any fevers, chills, increased pain, swelling, bleeding or pus, please contact your doctor.   ADDITIONAL INFORMATION  If you are going to have an upcoming dental procedure, please contact our office as you will require antibiotics ahead of time to prevent infection on your heart valve.    If you have any questions  or concerns you can call the structural heart phone during normal business hours 8am-4pm. If you have an urgent need after hours or weekends please call [phone] to talk to the on call provider for general cardiology. If you have an emergency that requires immediate attention, please call 911.    After TAVR Checklist  Check  Test Description   Follow up appointment in 1-2 weeks  You will see our structural heart advanced practice provider. Your incision sites will be checked and you will be cleared to drive and resume all normal activities if you are doing well.     1 month echo and follow up  You will have an echo to check on your new heart valve and be seen back in the office by a structural heart advanced practice provider.   Follow up with your primary cardiologist You will need to be seen by your primary cardiologist in the following 3-6 months after your 1 month appointment in the valve clinic.    1 year echo and follow up You will have another echo to check on your heart valve after 1 year   and be seen back in the office by a structural heart advanced practice provider. This your last structural heart visit.   Bacterial endocarditis prophylaxis  You will have to take antibiotics for the rest of your life before all dental procedures (even teeth cleanings) to protect your heart valve. Antibiotics are also required before some surgeries. Please check with your cardiologist before scheduling any surgeries. Also, please make sure to tell us if you have a penicillin allergy as you will require an alternative antibiotic.

## 2022-09-28 NOTE — Progress Notes (Signed)
Pt arrived from ...cath.., A/ox 4...pt denies any pain, MD aware,CCMD called. CHG bath given,no further needs at this time   

## 2022-09-28 NOTE — Anesthesia Postprocedure Evaluation (Signed)
Anesthesia Post Note  Patient: Zaviyar Rahal  Procedure(s) Performed: Transcatheter Aortic Valve Replacement, Transfemoral INTRAOPERATIVE TRANSTHORACIC ECHOCARDIOGRAM     Patient location during evaluation: PACU Anesthesia Type: MAC Level of consciousness: awake and alert Pain management: pain level controlled Vital Signs Assessment: post-procedure vital signs reviewed and stable Respiratory status: spontaneous breathing, nonlabored ventilation, respiratory function stable and patient connected to nasal cannula oxygen Cardiovascular status: stable and blood pressure returned to baseline Postop Assessment: no apparent nausea or vomiting Anesthetic complications: no  There were no known notable events for this encounter.  Last Vitals:  Vitals:   09/28/22 1425 09/28/22 1440  BP: 105/89   Pulse: 76   Resp: 20 20  Temp: 36.6 C   SpO2: 94%     Last Pain:  Vitals:   09/28/22 1425  TempSrc: Oral  PainSc: 0-No pain                 Tiajuana Amass

## 2022-09-28 NOTE — Progress Notes (Signed)
  [place] VALVE TEAM  Patient doing well s/p TAVR. He is hemodynamically stable. Groin sites stable. ECG with no high grade block. Arterial line discontinued and transferred to 4E. Bed rest for 6 hours given right groin bleeding. Plan for early ambulation after bedrest completed and hopeful discharge over the next 24-48 hours.   Angelena Form PA-C  [facility]  Pager [phone]

## 2022-09-29 ENCOUNTER — Other Ambulatory Visit: Payer: Self-pay

## 2022-09-29 ENCOUNTER — Inpatient Hospital Stay (HOSPITAL_COMMUNITY): Payer: Medicare Other

## 2022-09-29 DIAGNOSIS — Z952 Presence of prosthetic heart valve: Secondary | ICD-10-CM

## 2022-09-29 LAB — POCT ACTIVATED CLOTTING TIME: Activated Clotting Time: 141 seconds

## 2022-09-29 LAB — BASIC METABOLIC PANEL
Anion gap: 10 (ref 5–15)
BUN: 13 mg/dL (ref 8–23)
CO2: 24 mmol/L (ref 22–32)
Calcium: 8.8 mg/dL — ABNORMAL LOW (ref 8.9–10.3)
Chloride: 100 mmol/L (ref 98–111)
Creatinine, Ser: 0.89 mg/dL (ref 0.61–1.24)
GFR, Estimated: >60 mL/min (ref >60)
Glucose, Bld: 180 mg/dL — ABNORMAL HIGH (ref 70–99)
Potassium: 3.3 mmol/L — ABNORMAL LOW (ref 3.5–5.1)
Sodium: 134 mmol/L — ABNORMAL LOW (ref 135–145)

## 2022-09-29 LAB — POCT I-STAT 7, (LYTES, BLD GAS, ICA,H+H)
Acid-Base Excess: 0 mmol/L (ref 0.0–2.0)
Acid-Base Excess: 0 mmol/L (ref 0.0–2.0)
Bicarbonate: 25.2 mmol/L (ref 20.0–28.0)
Bicarbonate: 26.8 mmol/L (ref 20.0–28.0)
Calcium, Ion: 1.3 mmol/L (ref 1.15–1.40)
Calcium, Ion: 1.28 mmol/L (ref 1.15–1.40)
HCT: 41 % (ref 39.0–52.0)
HCT: 39 % (ref 39.0–52.0)
Hemoglobin: 13.9 g/dL (ref 13.0–17.0)
Hemoglobin: 13.3 g/dL (ref 13.0–17.0)
O2 Saturation: 100 %
O2 Saturation: 95 %
Potassium: 3.9 mmol/L (ref 3.5–5.1)
Potassium: 4.1 mmol/L (ref 3.5–5.1)
Sodium: 137 mmol/L (ref 135–145)
Sodium: 136 mmol/L (ref 135–145)
TCO2: 26 mmol/L (ref 22–32)
TCO2: 28 mmol/L (ref 22–32)
pCO2 arterial: 40.8 mmHg (ref 32–48)
pCO2 arterial: 48.9 mmHg — ABNORMAL HIGH (ref 32–48)
pH, Arterial: 7.399 (ref 7.35–7.45)
pH, Arterial: 7.346 — ABNORMAL LOW (ref 7.35–7.45)
pO2, Arterial: 170 mmHg — ABNORMAL HIGH (ref 83–108)
pO2, Arterial: 81 mmHg — ABNORMAL LOW (ref 83–108)

## 2022-09-29 LAB — CBC
HCT: 36.5 % — ABNORMAL LOW (ref 39.0–52.0)
Hemoglobin: 12.7 g/dL — ABNORMAL LOW (ref 13.0–17.0)
MCH: 31.7 pg (ref 26.0–34.0)
MCHC: 34.8 g/dL (ref 30.0–36.0)
MCV: 91 fL (ref 80.0–100.0)
Platelets: 167 10*3/uL (ref 150–400)
RBC: 4.01 MIL/uL — ABNORMAL LOW (ref 4.22–5.81)
RDW: 12.4 % (ref 11.5–15.5)
WBC: 12.6 10*3/uL — ABNORMAL HIGH (ref 4.0–10.5)
nRBC: 0 % (ref 0.0–0.2)

## 2022-09-29 LAB — MAGNESIUM: Magnesium: 1.3 mg/dL — ABNORMAL LOW (ref 1.7–2.4)

## 2022-09-29 LAB — GLUCOSE, CAPILLARY: Glucose-Capillary: 172 mg/dL — ABNORMAL HIGH (ref 70–99)

## 2022-09-29 LAB — ECHOCARDIOGRAM COMPLETE
AR max vel: 2.22 cm2
AV Area VTI: 2.24 cm2
AV Area mean vel: 2.55 cm2
AV Mean grad: 11 mmHg
AV Peak grad: 22.6 mmHg
Ao pk vel: 2.38 m/s
Area-P 1/2: 2.26 cm2
Height: 69 in
S' Lateral: 3.5 cm
Weight: 3974.4 oz

## 2022-09-29 LAB — POCT I-STAT, CHEM 8
BUN: 19 mg/dL (ref 8–23)
Calcium, Ion: 1.17 mmol/L (ref 1.15–1.40)
Chloride: 100 mmol/L (ref 98–111)
Creatinine, Ser: 0.7 mg/dL (ref 0.61–1.24)
Glucose, Bld: 197 mg/dL — ABNORMAL HIGH (ref 70–99)
HCT: 40 % (ref 39.0–52.0)
Hemoglobin: 13.6 g/dL (ref 13.0–17.0)
Potassium: 3.8 mmol/L (ref 3.5–5.1)
Sodium: 138 mmol/L (ref 135–145)
TCO2: 24 mmol/L (ref 22–32)

## 2022-09-29 MED ORDER — POTASSIUM CHLORIDE CRYS ER 20 MEQ PO TBCR
40.0000 meq | EXTENDED_RELEASE_TABLET | Freq: Once | ORAL | Status: AC
  Administered 2022-09-29: 40 meq via ORAL
  Filled 2022-09-29: qty 2

## 2022-09-29 NOTE — Inpatient Diabetes Management (Signed)
Inpatient Diabetes Program Recommendations  AACE/ADA: New Consensus Statement on Inpatient Glycemic Control (2015)  Target Ranges:  Prepandial:   less than 140 mg/dL      Peak postprandial:   less than 180 mg/dL (1-2 hours)      Critically ill patients:  140 - 180 mg/dL   Lab Results  Component Value Date   GLUCAP 172 (H) 09/29/2022   HGBA1C 7.5 (H) 09/24/2022    Review of Glycemic Control  Latest Reference Range & Units 09/28/22 16:46 09/28/22 22:43 09/29/22 06:08  Glucose-Capillary 70 - 99 mg/dL 184 (H) 197 (H) 172 (H)  (H): Data is abnormally high Diabetes history: Type 2 DM Outpatient Diabetes medications: Glipizide 5 mg QD, Metformin 500 mg BID Current orders for Inpatient glycemic control: Novolog 0-24 units TID & HS  Inpatient Diabetes Program Recommendations:    Consider adding Semglee 10 units QD.   Thanks, Bronson Curb, MSN, RNC-OB Diabetes Coordinator [phone] (8a-5p)

## 2022-09-29 NOTE — TOC Transition Note (Signed)
Transition of Care [facility]) - CM/SW Discharge Note   Patient Details  Name: Jordan Chaney MRN: [id number] Date of Birth: 1950/09/14  [facility] of [facility]) CM/SW Contact:  Levonne Lapping, RN Phone Number: 09/29/2022, 11:53 AM   Clinical Narrative:     Patient transitioning to home today with Son. PCP established, and patient will follow up as recommended. Patient has no TOC needs    Final next level of care: Home/Self Care Barriers to Discharge: No Barriers Identified   Patient Goals and CMS Choice   Choice offered to / list presented to : NA  Discharge Placement                         Discharge Plan and Services Additional resources added to the After Visit Summary for                  DME Arranged: N/A DME Agency: NA       HH Arranged: NA HH Agency: NA        Social Determinants of Health (SDOH) Interventions SDOH Screenings   Food Insecurity: No Food Insecurity (09/29/2022)  Housing: Low Risk  (09/29/2022)  Transportation Needs: No Transportation Needs (09/29/2022)  Utilities: Not At Risk (09/29/2022)  Financial Resource Strain: Low Risk  (11/09/2017)  Physical Activity: Insufficiently Active (11/09/2017)  Social Connections: Unknown (11/09/2017)  Tobacco Use: High Risk (09/28/2022)     Readmission Risk Interventions     No data to display

## 2022-09-29 NOTE — Progress Notes (Signed)
Discharge instructions reviewed with pt and son.  Copy of instructions given to pt. No new scripts. Questions answered.   Pt to be d/c'd via wheelchair with belongings, with son.             To be escorted by hospital volunteer.

## 2022-09-29 NOTE — Progress Notes (Addendum)
CARDIAC REHAB PHASE I   PRE:  Rate/Rhythm: 42 SR with PVC    BP: sitting 159/85    SaO2: 96 RA  MODE:  Ambulation: 300 ft   POST:  Rate/Rhythm: 104 ST    BP: sitting 132/74     SaO2: 95 RA  Pt moving well, contact guard assist, no AD.  Only c/o is his bad knee.  Sts he can tell his breathing is better. Discussed restrictions, walking as tolerated, tobacco cessation, diet, and CRPII. Pt receptive, voices he is not ready to quit chewing tobacco but thinking about it. Will refer to [place] BS, ACSM-CEP 09/29/2022 9:17 AM

## 2022-09-29 NOTE — Progress Notes (Signed)
  Echocardiogram 2D Echocardiogram has been performed.  Jordan Chaney M 09/29/2022, 9:30 AM

## 2022-09-29 NOTE — Op Note (Signed)
OPERATIVE NOTE: Patient Name: Jordan Chaney Date of Birth: 1950-09-26 Date of Operation: 09/28/22  PRE-OPERATIVE DIAGNOSIS: Severe, Symptomatic Aortic Stenosis   POST-OPERATIVE DIAGNOSIS: Same   OPERATION: Transcatheter Aortic Valve Replacement - Percutaneous  Transfemoral Approach  Medtronic Evolut FX (size 34 mm, serial # [id number] )   SURGEON: Pierre Bali Danyal Whitenack MD   Co-Surgeon: Lenna Sciara, MD    EBL 10cc   FINDINGS: Good implant depth No significant AI   SPECIMENS: None   COMPLICATIONS: None   TUBES:  None   INDICATIONS: Jordan Chaney 72 y.o. male  with severe symptomatic AS who in seen in consideration for TAVR.  He meets criteria for AV replacement.    His STS risk score is 1.4%   Risks/benefits/alternatives of TAVR were discussed at length and he agreed to proceed (specifically risk of stroke, femoral cutdown and pacemaker, and high risk of any bailout given that he has prior sternotomy). He understood and all questions answered.     Valve:486 mm2, suitable for 26 mm Sapien 3 valve.    Consideration also given to 29 vs 34 Evolut given perimeter of 80.58mm. Company measured at [employer] for 28% oversizing.    Access: Transfemoral Bailout: Yes NYHA: II    PROCEDURE IN DETAIL: The patient was brought in the operating room and laid in supine position.  The patient was prepped and draped in standard fashion. Arterial and venous lines were placed by anesthesia along under sedation. After a timeout was performed, right and left transfemoral access was gained with ultrasound guidance. Right IJ venous access was used to advance a temporary pacer into the right heart.    On the left femoral artery, a 6Fr sheath was placed and wire and pigtail through this into the noncoronary cusp.    We then used micropuncture techniques and ultrasound to approach the right groin and gained access and placed 2 Perclose devices. Via an 8 Fr sheath an Amplatz wire was placed and  then exchanged for a 18 Fr Dry Seal. The patient was heparinized systemically and ACT verified > 250 seconds.   Then an AL caltheter was used with flexible straight tip to get into the LV. No BAV performed. Then exchanged over a pigtail for a pre-curled wire.  Next, a balloon aortic valvuloplasty with 48mm balloon was performed under rapid pacing.   A Medtronic Evolut FX transcatheter heart valve (size 34 mm) was prepared and crimped per manufacturer's guidelines, and the proper orientation of the valve is confirmed on the Medtronic delivery system. The valve was advanced through the introducer sheath and across the aortic arch over the [facility] wire until it is positioned at the base of the pigtail catheter in the aortic valve annulus. Using the fine tuning wheel, valve deployment begins until annular contact is made. Controlled ventricular pacing is performed. Once proper position is confirmed via aortic root angiograms in the cusp overlap and LAO projections, the valve is deployed to 80% where it is fully functional. The patient's hemodynamic recovery following valve deployment is good.  Final position is confirmed and the valve is slowly deployed over 30 seconds until both paddles are released. The delivery catheter and pre-curled wire are removed and the valve is assessed with echocardiography. Echo demostrated acceptable post-procedural gradients, stable mitral valve function, and trace aortic insufficiency.   We did not have to recapture the valve at all during the procedure.  Implant depth was excellent. Hemodynamics were good. We then removed the sheath and deployed perclose  x 2. Protamine was administered.    Sheaths were removed and closure devices had been completed. I defer to Dr. Ali Lowe regarding the Mynx closure of artery.    The patient had a stable status and was transferred to the postoperative care unit in stable condition. All surgical counts were correct.

## 2022-09-30 ENCOUNTER — Telehealth: Payer: Self-pay

## 2022-09-30 NOTE — Telephone Encounter (Signed)
Patient contacted regarding discharge from [facility] on 09/29/2022.  Patient understands to follow up with provider Structural Heart APP on 10/08/2022 at 11:00 AM at [facility] office. Patient understands discharge instructions? yes Patient understands medications and regiment? yes Patient understands to bring all medications to this visit? Yes  The pt is doing well today, no complaints at this time.
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
## 2022-10-08 ENCOUNTER — Ambulatory Visit: Payer: Medicare Other | Attending: Physician Assistant | Admitting: Physician Assistant

## 2022-10-08 VITALS — BP 126/70 | HR 76 | Ht 69.0 in | Wt 251.2 lb

## 2022-10-08 DIAGNOSIS — N2889 Other specified disorders of kidney and ureter: Secondary | ICD-10-CM

## 2022-10-08 DIAGNOSIS — Z952 Presence of prosthetic heart valve: Secondary | ICD-10-CM | POA: Diagnosis present

## 2022-10-08 DIAGNOSIS — I10 Essential (primary) hypertension: Secondary | ICD-10-CM

## 2022-10-08 DIAGNOSIS — Z951 Presence of aortocoronary bypass graft: Secondary | ICD-10-CM

## 2022-10-08 MED ORDER — DIAZEPAM 5 MG PO TABS: 5.0000 mg | ORAL_TABLET | ORAL | 0 refills | Status: DC

## 2022-10-08 MED ORDER — AMOXICILLIN 500 MG PO TABS: 2000.0000 mg | ORAL_TABLET | ORAL | 12 refills | Status: AC

## 2022-10-08 NOTE — Patient Instructions (Addendum)
Medication Instructions:  Start Amoxicillin 500 mg, take 4 tablets by mouth 1 hour prior to dental procedures and cleanings.    *If you need a refill on your cardiac medications before your next appointment, please call your pharmacy*   Lab Work: None ordered  If you have labs (blood work) drawn today and your tests are completely normal, you will receive your results only by: [place] (if you have MyChart) OR A paper copy in the mail If you have any lab test that is abnormal or we need to change your treatment, we will call you to review the results.   Testing/Procedures: Your provider has ordered for you to have a MRI of your abdomen    Follow-Up: Follow up as scheduled   Other Instructions

## 2022-10-19 ENCOUNTER — Encounter: Payer: Self-pay | Admitting: Cardiovascular Disease

## 2022-10-19 ENCOUNTER — Ambulatory Visit: Payer: Medicare Other | Attending: Cardiovascular Disease | Admitting: Cardiovascular Disease

## 2022-10-19 VITALS — BP 134/62 | HR 71 | Ht 69.0 in | Wt 255.4 lb

## 2022-10-19 DIAGNOSIS — I350 Nonrheumatic aortic (valve) stenosis: Secondary | ICD-10-CM | POA: Diagnosis present

## 2022-10-19 NOTE — Patient Instructions (Signed)
Medication Instructions:  Your physician recommends that you continue on your current medications as directed. Please refer to the Current Medication list given to you today.  *If you need a refill on your cardiac medications before your next appointment, please call your pharmacy*   Follow-Up: At [facility], you and your health needs are our priority.  As part of our continuing mission to provide you with exceptional heart care, we have created designated Provider Care Teams.  These Care Teams include your primary Cardiologist (physician) and Advanced Practice Providers (APPs -  Physician Assistants and Nurse Practitioners) who all work together to provide you with the care you need, when you need it.  We recommend signing up for the patient portal called "MyChart".  Sign up information is provided on this After Visit Summary.  MyChart is used to connect with patients for Virtual Visits (Telemedicine).  Patients are able to view lab/test results, encounter notes, upcoming appointments, etc.  Non-urgent messages can be sent to your provider as well.   To learn more about what you can do with MyChart, go to [email or web address].    Your next appointment:   We will see you on an as needed basis.  Provider:   Jonathan Berry, MD  

## 2022-10-19 NOTE — Assessment & Plan Note (Signed)
Jordan Chaney was referred to me for severe aortic stenosis by Dr. Claudie Leach.  I performed right and left heart cath on him as an outpatient 07/01/2022 revealing patent grafts except for a diagonal branch vein graft which is occluded.  I was unable to cross his aortic valve.  I did send him to Dr. Ali Lowe for evaluation for TAVR which he ultimately had on 09/28/2022 and was discharged the following day.  His 2D echo performed on 1/17 showed normal LV systolic function with a well-functioning aortic bioprosthesis and trivial AI.  He feels clinically improved.

## 2022-10-19 NOTE — Progress Notes (Signed)
10/19/2022 Jordan Chaney   1951-01-31  [id number]  Primary Physician Guadlupe Spanish, MD Primary Cardiologist: Lorretta Harp MD [name], [place]  HPI:  Jordan Chaney is a 72 y.o.  mildly overweight married Caucasian male father of 2 sons, grandfather of 27 grandchildren who was referred by Dr. Claudie Leach, his primary cardiologist, for cardiac catheterization because of increasing dyspnea and severe aortic stenosis.  I last saw him in the office 06/29/2022.  He is retired from working in Architect and being a Games developer. His cardiac risk factors include 30 pack years of tobacco abuse having quit at age 89, treated hypertension, diabetes and hyperlipidemia. His father did have bypass surgery at age 32. The patient had CABG x4 at [facility] 11 years ago by Dr. Jerelene Redden. His presenting symptoms were progressive dyspnea as well. He had uncomplicated right rotator cuff surgery in [place] last Christmas. Over the last 4 to 5 years he has noted increasing dyspnea on exertion. Recent Myoview performed by Dr. Claudie Leach was nonischemic but 2D echo did show normal LV systolic function with at least moderate to severe aortic stenosis. The peak gradient was 80 mmHg with a valve area of 1.2 cm. He did have elective left carotid endarterectomy by Dr. Bridgett Larsson in 2019.  I performed outpatient right left heart cath on him 07/01/2022 revealing patent grafts except for an occluded diagonal branch graft.  I was unable to cross his aortic valve.  I did refer him to the structural heart team and he was seen by Dr.Thukkani and Dr.Enter .  He had a successful TAVR procedure performed 09/28/2022 with a 34 mm Medtronic evolute FX HTV  valve via the transfemoral approach.  His echo performed the following day showed normal LV systolic function with a well-functioning aortic bioprosthesis and trivial AI.  He feels clinically improved.   Current Meds  Medication Sig   acetaminophen (TYLENOL) 500 MG  tablet Take 500-1,000 mg by mouth every 6 (six) hours as needed for moderate pain.   allopurinol (ZYLOPRIM) 100 MG tablet Take 100 mg by mouth every Monday, Wednesday, and Friday.   aspirin EC 81 MG tablet Take 81 mg by mouth in the morning. Swallow whole.   Aspirin-Salicylamide-Caffeine (BC FAST PAIN RELIEF) 650-195-33.3 MG PACK Take 1 packet by mouth daily as needed (pain).   Cholecalciferol (VITAMIN D3) 50 MCG (2000 UT) capsule Take 2,000 Units by mouth in the morning.   Coenzyme Q10 (CO Q-10) 100 MG CAPS Take 100 mg by mouth daily.   diazepam (VALIUM) 5 MG tablet Take 1 tablet (5 mg total) by mouth as directed. Take 1-2 tablets as need 1-2 hours before MRI   folic acid (FOLVITE) 1 MG tablet Take 1 mg by mouth daily.   glipiZIDE (GLUCOTROL XL) 5 MG 24 hr tablet Take 5 mg by mouth daily with breakfast.   hydrochlorothiazide (HYDRODIURIL) 25 MG tablet Take 25 mg by mouth in the morning.   lisinopril (PRINIVIL,ZESTRIL) 40 MG tablet Take 40 mg by mouth in the morning.   meloxicam (MOBIC) 15 MG tablet Take 15 mg by mouth in the morning.   metFORMIN (GLUCOPHAGE) 500 MG tablet Take 500 mg by mouth 2 (two) times daily with a meal.   metoprolol succinate (TOPROL-XL) 100 MG 24 hr tablet Take 100 mg by mouth daily.   omeprazole (PRILOSEC) 40 MG capsule Take 40 mg by mouth in the morning.   Polyethyl Glycol-Propyl Glycol (LUBRICANT EYE DROPS) 0.4-0.3 % SOLN  Place 1-2 drops into both eyes 3 (three) times daily as needed (pain).   POTASSIUM PO Take 1 tablet by mouth 2 (two) times a week.   rosuvastatin (CRESTOR) 20 MG tablet Take 20 mg by mouth every evening.      No Known Allergies  Social History   Socioeconomic History   Marital status: Married    Spouse name: Not on file   Number of children: Not on file   Years of education: Not on file   Highest education level: Not on file  Occupational History   Not on file  Tobacco Use   Smoking status: Former    Packs/day: 1.00    Types: Cigarettes     Quit date: 2001    Years since quitting: 23.1   Smokeless tobacco: Current    Types: Chew   Tobacco comments:     "Quit smoking cigarettes in 2002"  Vaping Use   Vaping Use: Never used  Substance and Sexual Activity   Alcohol use: Yes    Comment: " occasional glass of wine"   Drug use: No   Sexual activity: Not on file  Other Topics Concern   Not on file  Social History Narrative   Not on file   Social Determinants of Health   Financial Resource Strain: Low Risk  (11/09/2017)   Overall Financial Resource Strain (CARDIA)    Difficulty of Paying Living Expenses: Not very hard  Food Insecurity: No Food Insecurity (09/29/2022)   Hunger Vital Sign    Worried About Running Out of Food in the Last Year: Never true    [place] in the Last Year: Never true  Transportation Needs: No Transportation Needs (09/29/2022)   PRAPARE - Hydrologist (Medical): No    Lack of Transportation (Non-Medical): No  Physical Activity: Insufficiently Active (11/09/2017)   Exercise Vital Sign    Days of Exercise per Week: 4 days    Minutes of Exercise per Session: 30 min  Stress: Not on file  Social Connections: Unknown (11/09/2017)   Social Connection and Isolation Panel [NHANES]    Frequency of Communication with Friends and Family: Patient refused    Frequency of Social Gatherings with Friends and Family: Patient refused    Attends Religious Services: Patient refused    Active Member of Clubs or Organizations: Patient refused    Attends Archivist Meetings: Patient refused    Marital Status: Patient refused  Intimate Partner Violence: Not At Risk (09/29/2022)   Humiliation, Afraid, Rape, and Kick questionnaire    Fear of Current or Ex-Partner: No    Emotionally Abused: No    Physically Abused: No    Sexually Abused: No     Review of Systems: General: negative for chills, fever, night sweats or weight changes.  Cardiovascular: negative for chest  pain, dyspnea on exertion, edema, orthopnea, palpitations, paroxysmal nocturnal dyspnea or shortness of breath Dermatological: negative for rash Respiratory: negative for cough or wheezing Urologic: negative for hematuria Abdominal: negative for nausea, vomiting, diarrhea, bright red blood per rectum, melena, or hematemesis Neurologic: negative for visual changes, syncope, or dizziness All other systems reviewed and are otherwise negative except as noted above.    Blood pressure 134/62, pulse 71, height 5\' 9"  (1.753 m), weight 255 lb 6.4 oz (115.8 kg), SpO2 95 %.  General appearance: alert and no distress Neck: no adenopathy, no JVD, supple, symmetrical, trachea midline, thyroid not enlarged, symmetric, no tenderness/mass/nodules, and soft  bilateral carotid bruits Lungs: clear to auscultation bilaterally Heart: regular rate and rhythm, S1, S2 normal, no murmur, click, rub or gallop Extremities: extremities normal, atraumatic, no cyanosis or edema Pulses: 2+ and symmetric Skin: Skin color, texture, turgor normal. No rashes or lesions Neurologic: Grossly normal  EKG not performed today  ASSESSMENT AND PLAN:   Severe aortic stenosis Mr. Rashid was referred to me for severe aortic stenosis by Dr. Claudie Leach.  I performed right and left heart cath on him as an outpatient 07/01/2022 revealing patent grafts except for a diagonal branch vein graft which is occluded.  I was unable to cross his aortic valve.  I did send him to Dr. Ali Lowe for evaluation for TAVR which he ultimately had on 09/28/2022 and was discharged the following day.  His 2D echo performed on 1/17 showed normal LV systolic function with a well-functioning aortic bioprosthesis and trivial AI.  He feels clinically improved.     Lorretta Harp MD FACP,FACC,FAHA, [facility] 10/19/2022 9:43 AM

## 2022-10-22 ENCOUNTER — Ambulatory Visit (HOSPITAL_COMMUNITY)
Admission: RE | Admit: 2022-10-22 | Discharge: 2022-10-22 | Disposition: A | Discharge status: Home / Self Care | Payer: Medicare Other | Source: Ambulatory Visit | Attending: Physician Assistant | Admitting: Physician Assistant

## 2022-10-22 DIAGNOSIS — N2889 Other specified disorders of kidney and ureter: Secondary | ICD-10-CM | POA: Insufficient documentation

## 2022-10-22 MED ORDER — GADOBUTROL 1 MMOL/ML IV SOLN
10.0000 mL | Freq: Once | INTRAVENOUS | Status: AC | PRN
  Administered 2022-10-22: 10 mL via INTRAVENOUS
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
## 2022-10-27 ENCOUNTER — Ambulatory Visit (HOSPITAL_COMMUNITY): Payer: Medicare Other | Attending: Cardiology

## 2022-10-27 ENCOUNTER — Ambulatory Visit (INDEPENDENT_AMBULATORY_CARE_PROVIDER_SITE_OTHER): Payer: Medicare Other | Admitting: Cardiology

## 2022-10-27 VITALS — BP 120/72 | HR 68 | Ht 69.0 in | Wt 254.6 lb

## 2022-10-27 DIAGNOSIS — I10 Essential (primary) hypertension: Secondary | ICD-10-CM | POA: Insufficient documentation

## 2022-10-27 DIAGNOSIS — Z952 Presence of prosthetic heart valve: Secondary | ICD-10-CM

## 2022-10-27 DIAGNOSIS — Z951 Presence of aortocoronary bypass graft: Secondary | ICD-10-CM | POA: Insufficient documentation

## 2022-10-27 DIAGNOSIS — K862 Cyst of pancreas: Secondary | ICD-10-CM | POA: Insufficient documentation

## 2022-10-27 DIAGNOSIS — N2889 Other specified disorders of kidney and ureter: Secondary | ICD-10-CM

## 2022-10-27 DIAGNOSIS — Z87891 Personal history of nicotine dependence: Secondary | ICD-10-CM

## 2022-10-27 DIAGNOSIS — I350 Nonrheumatic aortic (valve) stenosis: Secondary | ICD-10-CM | POA: Insufficient documentation

## 2022-10-27 LAB — ECHOCARDIOGRAM COMPLETE
AV Mean grad: 7.5 mmHg
AV Peak grad: 16.3 mmHg
Ao pk vel: 2.02 m/s
Area-P 1/2: 2.38 cm2
S' Lateral: 2.3 cm

## 2022-10-27 NOTE — Patient Instructions (Signed)
Medication Instructions:  Your physician recommends that you continue on your current medications as directed. Please refer to the Current Medication list given to you today.  *If you need a refill on your cardiac medications before your next appointment, please call your pharmacy*   Lab Work: NONE If you have labs (blood work) drawn today and your tests are completely normal, you will receive your results only by: MyChart Message (if you have MyChart) OR A paper copy in the mail If you have any lab test that is abnormal or we need to change your treatment, we will call you to review the results.   Testing/Procedures: NONE   Follow-Up: At [facility], you and your health needs are our priority.  As part of our continuing mission to provide you with exceptional heart care, we have created designated Provider Care Teams.  These Care Teams include your primary Cardiologist (physician) and Advanced Practice Providers (APPs -  Physician Assistants and Nurse Practitioners) who all work together to provide you with the care you need, when you need it.  We recommend signing up for the patient portal called "MyChart".  Sign up information is provided on this After Visit Summary.  MyChart is used to connect with patients for Virtual Visits (Telemedicine).  Patients are able to view lab/test results, encounter notes, upcoming appointments, etc.  Non-urgent messages can be sent to your provider as well.   To learn more about what you can do with MyChart, go to [email or web address].    Your next appointment:   KEEP SCHEDULED FOLLOW-UP 

## 2023-02-14 NOTE — ED Provider Notes (Signed)
 ------------------------------------------------------------------------------- Attestation signed by Katheryn Rollo Jewels, DO at 02/15/23 1351 Patient was seen by PA or NP and I was not involved in patient's work-up, care plan, treatment or disposition.  I was available for consultation at all points during patient's visit.   -------------------------------------------------------------------------------   [facility]  ED Provider Note  Jordan Chaney 72 y.o. male DOB: Jul 25, 1951 MRN: [id number] History   Chief Complaint  Patient presents with  . Flank Pain    Left side started this morning urinating blood   Patient presents to the ED for Flank pain. Symptoms started this morning. +left flank pain. +dysuria. Max temp of 100. +nausea without vomiting.    History provided by:  Patient Language interpreter used: No       Past Medical History:  Diagnosis Date  . Arthritis   . Coronary artery disease   . GERD (gastroesophageal reflux disease)   . Gout   . Hypertension     Past Surgical History:  Procedure Laterality Date  . Anterior cruciate ligament repair Left   . Coronary artery bypass graft     Quadruple bypass  . Knee arthroscopy Right 09/23/2016    Social History   Substance and Sexual Activity  Alcohol Use Yes  . Alcohol/week: 2.0 standard drinks of alcohol  . Types: 2 Glasses of wine per week   Comment: occasional    Social History   Tobacco Use  Smoking Status Former  . Types: Cigarettes  . Quit date: 09/14/1999  . Years since quitting: 23.4  Smokeless Tobacco Current  Tobacco Comments   chew    E-Cigarettes  . Vaping Use    . Start Date    . Cartridges/Day    . Quit Date     Social History   Substance and Sexual Activity  Drug Use No         No Known Allergies  Discharge Medication List as of 02/14/2023  5:15 PM     CONTINUE these medications which have NOT CHANGED   Details  allopurinol  (ZYLOPRIM )  100 mg tablet Take 100 mg by mouth daily., Until Discontinued, Historical Med    aspirin  (ASPIRIN ) 81 mg chewable tablet Chew 81 mg by mouth daily., Until Discontinued, Historical Med    atorvastatin  (LIPITOR) 40 mg tablet Take 40 mg by mouth daily., Until Discontinued, Historical Med    hydrochlorothiazide  (HYDRODIURIL ) 25 mg tablet Take 25 mg by mouth daily., Until Discontinued, Historical Med    !! HYDROcodone-acetaminophen  (NORCO) 5-325 mg per tablet Take one tablet by mouth every 6 (six) hours as needed for Pain., Starting Tue 09/21/2016, Print    lisinopril  (PRINIVIL ,ZESTRIL ) 20 mg tablet Take 20 mg by mouth daily., Until Discontinued, Historical Med    metoprolol  succinate (TOPROL -XL) 25 mg 24 hr tablet Take 25 mg by mouth daily., Until Discontinued, Historical Med    nitroGLYCERIN  (NITROSTAT ) 0.4 mg SL tablet TAKE 1 TAB SUBLINGUAL AS NEEDED EVERY 5 MINS UP TO 3 TIMES FOR CHEST PAIN, Historical Med    omeprazole (PRILOSEC) 20 mg capsule Take 20 mg by mouth at bedtime. , Historical Med     !! - Potential duplicate medications found. Please discuss with provider.      Review of Systems   Review of Systems  Constitutional:  Positive for fever.  Gastrointestinal:  Positive for abdominal pain and nausea. Negative for vomiting.  Genitourinary:  Positive for dysuria and flank pain.  All other systems reviewed and are negative.   Physical Exam  ED Triage Vitals [02/14/23 1315]  BP (!) 175/86  Heart Rate 81  Resp 18  SpO2 98 %  Temp 98 F (36.7 C)    Physical Exam  Nursing note and vitals reviewed. Constitutional: He appears well-developed and well-nourished.  HENT:  Head: Normocephalic and atraumatic.  Eyes: Conjunctivae are normal.  Neck: Normal range of motion.  Cardiovascular: Normal rate, regular rhythm and normal heart sounds.  Pulmonary/Chest: Respiratory effort normal and breath sounds normal.  Abdominal: Soft. There is no abdominal tenderness. There is no  guarding and no rebound. Abdomen not distended. Bowel sounds are normal. There is no CVA tenderness.  Musculoskeletal:     Cervical back: Normal range of motion.   Neurological: He is alert and oriented to person, place, and time.  Skin: Skin is warm. Skin is dry.    ED Course   Lab results:   CBC AND DIFFERENTIAL - Abnormal      Result Value   WBC 16.5 (*)    RBC 4.51 (*)    HGB 14.2     HCT 41.9     MCV 92.9 (*)    MCH 31.5     MCHC 33.9     Plt Ct 205     RDW SD 43.8     MPV 10.9     NRBC% 0.0     Absolute NRBC Count 0.00     NEUTROPHIL % 84.1     LYMPHOCYTE % 8.5     MONOCYTE % 6.2     Eosinophil % 0.5     BASOPHIL % 0.2     IG% 0.5     ABSOLUTE NEUTROPHIL COUNT 13.91 (*)    ABSOLUTE LYMPHOCYTE COUNT 1.40     Absolute Monocyte Count 1.02 (*)    Absolute Eosinophil Count 0.09     Absolute Basophil Count 0.03     Absolute Immature Granulocyte Count 0.08 (*)   COMPREHENSIVE METABOLIC PANEL - Abnormal   Na 132 (*)    Potassium 3.4 (*)    Cl 96 (*)    CO2 22     AGAP 14     Glucose 185 (*)    BUN 16     Creatinine 0.92     Ca 9.8     ALK PHOS 47     T Bili 1.22 (*)    Total Protein 6.9     Alb 4.3     GLOBULIN 2.6     ALBUMIN/GLOBULIN RATIO 1.7     BUN/CREAT RATIO 17.4     ALT 58 (*)    AST 32     eGFR 88     Comment: Normal GFR (glomerular filtration rate) > 60 mL/min/1.73 meters squared, < 60 may include impaired kidney function. Calculation based on the Chronic Kidney Disease Epidemiology Collaboration (CK-EPI)equation refit without adjustment for race.  URINALYSIS W/MICRO REFLEX CULTURE - SYMPTOMATIC - Abnormal   Urine Color Yellow     Urine Clarity Cloudy (*)    Urine Specific Gravity 1.011     Urine pH 5.5     Urine Protein - Dipstick Negative     Urine Glucose Negative     Urine Ketones Negative     Urine Bilirubin Negative     Urine Blood 1.0 (*)    Urine Nitrite Negative     Urine Urobilinogen <2     Urine Leukocyte Esterase 500 (*)     Urine Squamous Epithelial Cells 0-2     Urine  WBC >50 (*)    Urine RBC 20-30 (*)    Urine Bacteria Trace     Urine Mucous Trace     UA Microscopic Yes Micro (*)   LACTIC ACID - Normal   Lactic Acid 1.3    CULTURE, URINE  LIGHT BLUE TOP  GOLD SST    Imaging:   CT ABDOMEN PELVIS W IV CONTRAST   Narrative:    EXAMINATION: CT ABDOMEN/PELVIS WITH CONTRAST  INDICATION: left flank pain  TECHNIQUE: Computed tomography of the abdomen, and pelvis was performed after administration of intravenous contrast, 100 mL ml of Isovue-370, without immediate complication, according to routine protocol. A dose lowering technique was used for this procedure, which may include, but is not limited to, dose reduction techniques, automated exposure control, the use of a iterative reconstruction, and ALARA (as low as reasonably achievable)/image gently techniques.   COMPARISON: None available.  FINDINGS: Lower Chest: Visualized portions of the lung bases are clear. Mild cardiomegaly with status post TAVR. A moderate hiatal hernia is present.   Hepatobiliary: There is diffuse low-density seen throughout the liver parenchyma with mild hepatomegaly. The main portal vein is patent.   Layering calcified gallstones are present.  Pancreas: No pancreatic ductal dilation or surrounding inflammatory changes.  Spleen: Normal in size without focal abnormality.   Adrenals/Urinary Tract: Both adrenal glands appear normal.  Right-sided low-density lesion seen within the mid pole, likely renal cyst. Within the upper pole there is a slightly hyperdense 2 cm lesion present. No renal or collecting system calculi are. However there is a partially decompressed bladder with apparent bladder wall thickening stranding changes.  Stomach/Bowel: The stomach, small bowel, and colon are normal in appearance. No inflammatory changes, wall thickening, or obstructive findings.  Scattered colonic diverticulosis is seen.  Lymph Nodes/  Vascular:No retroperitoneal, mesenteric, or inguinal lymphadenopathy. No significant vascular findings are present. Moderate to advanced aortic atherosclerosis is seen.  Pelvis:   The prostate gland is unremarkable.   Other: No abdominal wall mass or hernia. No free fluid.   Musculoskeletal: No acute or significant osseous abnormality.    Impression:    IMPRESSION: 1.  Partially decompressed bladder with apparent wall thickening which may be due to physiologic underdistention versus mild diabetes 2.  No renal or collecting calculi. 3.  Slightly hyperdense 2 cm lesion within the midpole of the right kidney which is nonspecific. This may represent a proteinaceous/hemorrhagic cyst. Would recommend dedicated renal ultrasound for further evaluation. 4.  Hepatic steatosis and mild hepatomegaly 5.  Cholelithiasis  Electronically Signed by: Jann Dubs Avutu on 02/14/2023 3:35 PM    ECG: ECG Results   None                               Pre-Sedation Procedures    Medical Decision Making +leukocytosis in the setting of cystitis. Afebrile with stable vitals. Lactic WNL. IV ABX therapy initiated here. Return precautions given.   Amount and/or Complexity of Data Reviewed Labs: ordered. Radiology: ordered.  Risk Prescription drug management.       Provider Communication  Discharge Medication List as of 02/14/2023  5:15 PM     START taking these medications   Details  cephALEXin (KEFLEX) 500 mg capsule Take one capsule (500 mg dose) by mouth 4 (four) times daily for 10 days., Starting Mon 02/14/2023, Until Thu 02/24/2023, Normal    !! HYDROcodone-acetaminophen  (NORCO) 5-325 mg per tablet Take one tablet by mouth  every 6 (six) hours as needed for Pain for up to 10 days. Max Daily Amount: 4 tablets, Starting Mon 02/14/2023, Until Thu 02/24/2023 at 2359, Normal    ondansetron  (ZOFRAN -ODT) 4 mg disintegrating tablet Take one tablet (4 mg dose) by mouth every 8 (eight) hours  as needed for Nausea for up to 7 days., Starting Mon 02/14/2023, Until Mon 02/21/2023 at 2359, Normal     !! - Potential duplicate medications found. Please discuss with provider.      Discharge Medication List as of 02/14/2023  5:15 PM      Discharge Medication List as of 02/14/2023  5:15 PM      Clinical Impression   Final diagnoses:  Renal cyst  Flank pain  Cystitis    ED Disposition     ED Disposition  Discharge   Condition  Stable   Comment  --                 Follow-up Information     Selma LELON Albright, MD.   Specialties: Urology, Urological Surgery  Contact information: [address] [place] [address] [phone]                  Electronically signed by:    Shelda LOISE Fireman, PA 02/14/23 1847

## 2023-07-20 ENCOUNTER — Other Ambulatory Visit: Payer: Self-pay | Admitting: Cardiology

## 2023-07-20 DIAGNOSIS — Z952 Presence of prosthetic heart valve: Secondary | ICD-10-CM

## 2023-09-30 NOTE — Progress Notes (Unsigned)
HEART AND VASCULAR CENTER   MULTIDISCIPLINARY HEART VALVE TEAM  Structural Heart Office Note:  .    Date:  10/03/2023  ID:  Jordan Chaney, DOB 1951-03-26, MRN [id number] PCP: Karle Plumber, MD  [facility] Providers Cardiologist:  Nanetta Batty, MD  History of Present Illness: .   Jordan Benji Heffern is a 73 y.o. 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    The patient was referred to Dr. Allyson Sabal from Dr. Hanley Hays for evaluation of severe AS and increasing dyspnea. He underwent cardiac catheterization 07/01/22 which demonstrated patent bypass grafts aside from an occluded vein graft to diagonal. His right heart catheterization pressures were relatively normal with a preserved cardiac output and index. Dr Allyson Sabal was unable to cross the aortic valve suggesting that it was severely stenotic. Repeat echo at our office on 08/11/22 showed EF 60% and severe AS with mean grad 42 mmHg. He was referred to structural heart for consideration of TAVR. Due to poor dentition, he underwent dental extractions.   He was then evaluated by the multidisciplinary valve team and underwent  successful TAVR with a 34 mm Medtronic Evolut FX HTV via the TF approach on 09/28/22. Post operative echo showed EF 70%, normally functioning TAVR with a mean gradient of 11 mmHg and trivial PVL. He was continued on home Asprin 81 mg daily.      Today he is here alone and reports he has been very well since he was last seen. He is scheduled for regular follow up with Dr. Hanley Hays next month. Continues to deny chest pain, SOB, palpitations, LE edema, orthopnea, PND, dizziness, or syncope. Denies bleeding in stool or urine.    Physical Exam:   VS:  BP 118/64   Pulse 64   Ht 5\' 9"  (1.753 m)   Wt 254 lb 3.2 oz (115.3 kg)    SpO2 95%   BMI 37.54 kg/m    Wt Readings from Last 3 Encounters:  10/03/23 254 lb 3.2 oz (115.3 kg)  10/27/22 254 lb 9.6 oz (115.5 kg)  10/19/22 255 lb 6.4 oz (115.8 kg)    General: Well developed, well nourished, NAD Lungs:Clear to ausculation bilaterally. No wheezes, rales, or rhonchi. Breathing is unlabored. Cardiovascular: RRR with S1 S2. No murmurs Extremities: No edema.  Neuro: Alert and oriented. No focal deficits. No facial asymmetry. MAE spontaneously. Psych: Responds to questions appropriately with normal affect.    ASSESSMENT AND PLAN: .    Severe AS s/p TAVR: Patient doing well with NYHA class I symptoms s/p TAVR. Echocardiogram today with normal LV function with stable 34mm Medtronic Core valve function with a mean gradient at , peak odf 10.20mmHg, and trivial PVL. Continue on Asprin 81 mg daily and lifelong dental SBE. Keep follow up with Dr. Hanley Hays at [facility].    CAD s/p CABG: [facility] 07/01/22 demonstrated patent bypass grafts aside from an occluded vein graft to diagonal. Denies anginal symptoms. Continue ASA 81, Lisinopril, and Crestor.    HTN: BP well controlled today. No changes needed at this time.    Renal mass/pancreatic cyst: Pre TAVR CT showed a "hypodense 2.5 cm anterior interpolar right renal cortical mass, indeterminate for renal neoplasm. MRI showed renal cyst with no further follow up recommended however  with pancreatic cyst with recommendations for 2 year imaging. He will no longer be followed by the Structural Heart team at that time therefore will defer to his PCP for ordering. I have faxed this note to their team.   I spent 20 minutes caring for this patient today including face-to-face discussions, ordering and reviewing labs, reviewing records from [facility] and other outside facilities, documenting in the record, and arranging for follow up.    Signed, Georgie Chard, NP

## 2023-10-03 ENCOUNTER — Ambulatory Visit (HOSPITAL_BASED_OUTPATIENT_CLINIC_OR_DEPARTMENT_OTHER): Payer: Medicare HMO

## 2023-10-03 ENCOUNTER — Ambulatory Visit: Payer: Medicare HMO | Attending: Cardiology | Admitting: Cardiology

## 2023-10-03 VITALS — BP 118/64 | HR 64 | Ht 69.0 in | Wt 254.2 lb

## 2023-10-03 DIAGNOSIS — I350 Nonrheumatic aortic (valve) stenosis: Secondary | ICD-10-CM

## 2023-10-03 DIAGNOSIS — I10 Essential (primary) hypertension: Secondary | ICD-10-CM | POA: Diagnosis not present

## 2023-10-03 DIAGNOSIS — Z952 Presence of prosthetic heart valve: Secondary | ICD-10-CM

## 2023-10-03 DIAGNOSIS — Z951 Presence of aortocoronary bypass graft: Secondary | ICD-10-CM | POA: Diagnosis not present

## 2023-10-03 LAB — ECHOCARDIOGRAM COMPLETE
AV Mean grad: 5 mm[Hg]
AV Peak grad: 10.6 mm[Hg]
Ao pk vel: 1.63 m/s
Area-P 1/2: 2.95 cm2
Height: 69 in
P 1/2 time: 610 ms
S' Lateral: 2.6 cm
Weight: 4067.2 [oz_av]

## 2023-10-03 NOTE — Patient Instructions (Signed)
Medication Instructions:  The current medical regimen is effective;  continue present plan and medications.  *If you need a refill on your cardiac medications before your next appointment, please call your pharmacy*  Testing/Procedures: Your physician has requested that you have an echocardiogram, today as scheduled.  Echocardiography is a painless test that uses sound waves to create images of your heart. It provides your doctor with information about the size and shape of your heart and how well your heart's chambers and valves are working. This procedure takes approximately one hour. There are no restrictions for this procedure. Please do NOT wear cologne, perfume, aftershave, or lotions (deodorant is allowed). Please arrive 15 minutes prior to your appointment time.  Please note: We ask at that you not bring children with you during ultrasound (echo/ vascular) testing. Due to room size and safety concerns, children are not allowed in the ultrasound rooms during exams. Our front office staff cannot provide observation of children in our lobby area while testing is being conducted. An adult accompanying a patient to their appointment will only be allowed in the ultrasound room at the discretion of the ultrasound technician under special circumstances. We apologize for any inconvenience.   Follow-Up: At [facility], you and your health needs are our priority.  As part of our continuing mission to provide you with exceptional heart care, we have created designated Provider Care Teams.  These Care Teams include your primary Cardiologist (physician) and Advanced Practice Providers (APPs -  Physician Assistants and Nurse Practitioners) who all work together to provide you with the care you need, when you need it.  We recommend signing up for the patient portal called "MyChart".  Sign up information is provided on this After Visit Summary.  MyChart is used to connect with patients for Virtual  Visits (Telemedicine).  Patients are able to view lab/test results, encounter notes, upcoming appointments, etc.  Non-urgent messages can be sent to your provider as well.   To learn more about what you can do with MyChart, go to [email or web address].    Your next appointment:   Follow up as needed.

## 2023-10-05 ENCOUNTER — Other Ambulatory Visit (HOSPITAL_COMMUNITY): Payer: Medicare Other

## 2023-10-05 ENCOUNTER — Ambulatory Visit: Payer: Medicare Other
